# Patient Record
Sex: Male | Born: 1965 | Race: Black or African American | Hispanic: No | Marital: Married | Smoking: Current every day smoker
Health system: Southern US, Community
[De-identification: ages and names within clinical notes are randomized; demographics above are authoritative.]

## PROBLEM LIST (undated history)

## (undated) DIAGNOSIS — J449 Chronic obstructive pulmonary disease, unspecified: Secondary | ICD-10-CM

## (undated) DIAGNOSIS — E119 Type 2 diabetes mellitus without complications: Secondary | ICD-10-CM

---

## 2018-12-07 ENCOUNTER — Encounter: Payer: Self-pay | Admitting: Emergency Medicine

## 2018-12-07 ENCOUNTER — Other Ambulatory Visit: Payer: Self-pay

## 2018-12-07 ENCOUNTER — Emergency Department: Payer: Self-pay

## 2018-12-07 ENCOUNTER — Emergency Department
Admission: EM | Admit: 2018-12-07 | Discharge: 2018-12-07 | Disposition: A | Discharge status: Home / Self Care | Payer: Self-pay | Attending: Emergency Medicine | Admitting: Emergency Medicine

## 2018-12-07 DIAGNOSIS — E119 Type 2 diabetes mellitus without complications: Secondary | ICD-10-CM | POA: Insufficient documentation

## 2018-12-07 DIAGNOSIS — R079 Chest pain, unspecified: Secondary | ICD-10-CM | POA: Insufficient documentation

## 2018-12-07 DIAGNOSIS — F17200 Nicotine dependence, unspecified, uncomplicated: Secondary | ICD-10-CM | POA: Insufficient documentation

## 2018-12-07 HISTORY — DX: Type 2 diabetes mellitus without complications: E11.9

## 2018-12-07 LAB — CBC WITH DIFFERENTIAL/PLATELET
Abs Immature Granulocytes: 0.03 10*3/uL (ref 0.00–0.07)
Basophils Absolute: 0.1 10*3/uL (ref 0.0–0.1)
Basophils Relative: 1 %
EOS ABS: 0.2 10*3/uL (ref 0.0–0.5)
Eosinophils Relative: 2 %
HCT: 51.2 % (ref 39.0–52.0)
Hemoglobin: 16.2 g/dL (ref 13.0–17.0)
Immature Granulocytes: 0 %
Lymphocytes Relative: 35 %
Lymphs Abs: 2.7 10*3/uL (ref 0.7–4.0)
MCH: 27.7 pg (ref 26.0–34.0)
MCHC: 31.6 g/dL (ref 30.0–36.0)
MCV: 87.7 fL (ref 80.0–100.0)
MONO ABS: 0.4 10*3/uL (ref 0.1–1.0)
MONOS PCT: 5 %
Neutro Abs: 4.3 10*3/uL (ref 1.7–7.7)
Neutrophils Relative %: 57 %
Platelets: 193 10*3/uL (ref 150–400)
RBC: 5.84 MIL/uL — ABNORMAL HIGH (ref 4.22–5.81)
RDW: 13.2 % (ref 11.5–15.5)
WBC: 7.6 10*3/uL (ref 4.0–10.5)
nRBC: 0 % (ref 0.0–0.2)

## 2018-12-07 LAB — COMPREHENSIVE METABOLIC PANEL
ALT: 25 U/L (ref 0–44)
AST: 20 U/L (ref 15–41)
Albumin: 4.6 g/dL (ref 3.5–5.0)
Alkaline Phosphatase: 62 U/L (ref 38–126)
Anion gap: 7 (ref 5–15)
BUN: 15 mg/dL (ref 6–20)
CO2: 26 mmol/L (ref 22–32)
Calcium: 9.6 mg/dL (ref 8.9–10.3)
Chloride: 105 mmol/L (ref 98–111)
Creatinine, Ser: 1.03 mg/dL (ref 0.61–1.24)
GFR calc non Af Amer: >60 mL/min (ref >60)
Glucose, Bld: 128 mg/dL — ABNORMAL HIGH (ref 70–99)
Potassium: 4.2 mmol/L (ref 3.5–5.1)
Sodium: 138 mmol/L (ref 135–145)
Total Bilirubin: 0.7 mg/dL (ref 0.3–1.2)
Total Protein: 7.7 g/dL (ref 6.5–8.1)

## 2018-12-07 LAB — TROPONIN I: Troponin I: <0.03 ng/mL (ref <0.03)

## 2018-12-07 LAB — FIBRIN DERIVATIVES D-DIMER (ARMC ONLY): Fibrin derivatives D-dimer (ARMC): 303.53 ng/mL (FEU) (ref 0.00–499.00)

## 2018-12-07 MED ORDER — ASPIRIN 81 MG PO CHEW
324.0000 mg | CHEWABLE_TABLET | Freq: Once | ORAL | Status: AC
  Administered 2018-12-07: 324 mg via ORAL
  Filled 2018-12-07: qty 4

## 2018-12-07 MED ORDER — MORPHINE SULFATE (PF) 2 MG/ML IV SOLN
2.0000 mg | Freq: Once | INTRAVENOUS | Status: AC
  Administered 2018-12-07: 2 mg via INTRAVENOUS
  Filled 2018-12-07: qty 1

## 2018-12-07 NOTE — Discharge Instructions (Signed)

## 2018-12-07 NOTE — ED Provider Notes (Signed)
[name] Emergency Department Provider Note    First MD Initiated Contact with Patient 12/07/18 [phone]     (approximate)  I have reviewed the triage vital signs and the nursing notes.   HISTORY  Chief Complaint Chest Pain    HPI Vincent Morton is a 53 y.o. male with history of diabetes mellitus and 30-year history of crack cocaine use (patient quit 8 years ago) presents with intermittent nonradiating central sharp chest pain times "months".  Patient states that episodes are usually brief and as a result he has not seek medical attention for it.  Patient states however tonight pain was intense with current pain score of 9 out of 10 worse with deep inspiration, movement and palpation.  Patient denies any lower extremity pain or swelling.  Patient denies any nausea or diaphoresis.   Past Medical History:  Diagnosis Date  . Diabetes mellitus without complication (HCC)     There are no active problems to display for this patient.   History reviewed. No pertinent surgical history.  Prior to Admission medications   Not on File    Allergies Patient has no known allergies.  Family history Brother and mother with history of congestive heart failure  Social History Social History   Tobacco Use  . Smoking status: Current Every Day Smoker  . Smokeless tobacco: Never Used  Substance Use Topics  . Alcohol use: Not on file  . Drug use: Not on file    Review of Systems  Constitutional: No fever/chills Eyes: No visual changes. ENT: No sore throat. Cardiovascular: Positive for chest pain. Respiratory: Denies shortness of breath. Gastrointestinal: No abdominal pain.  No nausea, no vomiting.  No diarrhea.  No constipation. Genitourinary: Negative for dysuria. Musculoskeletal: Negative for neck pain.  Negative for back pain. Integumentary: Negative for rash. Neurological: Negative for headaches, focal weakness or  numbness.   ____________________________________________   PHYSICAL EXAM:  VITAL SIGNS: ED Triage Vitals  Enc Vitals Group     BP 12/07/18 0450 130/70     Pulse Rate 12/07/18 0450 70     Resp 12/07/18 0450 18     Temp 12/07/18 0450 98.6 F (37 C)     Temp Source 12/07/18 0450 Oral     SpO2 12/07/18 0450 97 %     Weight 12/07/18 0448 72.6 kg (160 lb)     Height 12/07/18 0448 1.727 m (5\' 8" )     Head Circumference --      Peak Flow --      Pain Score 12/07/18 0448 7     Pain Loc --      Pain Edu? --      Excl. in GC? --     Constitutional: Alert and oriented.  Apparent discomfort  eyes: Conjunctivae are normal.  Mouth/Throat: Mucous membranes are moist.  Oropharynx non-erythematous. Neck: No stridor.   Chest: Pain with parasternal palpation. Cardiovascular: Normal rate, regular rhythm. Good peripheral circulation. Grossly normal heart sounds. Respiratory: Normal respiratory effort.  No retractions. Lungs CTAB. Gastrointestinal: Soft and nontender. No distention.  Musculoskeletal: No lower extremity tenderness nor edema. No gross deformities of extremities. Neurologic:  Normal speech and language. No gross focal neurologic deficits are appreciated.  Skin:  Skin is warm, dry and intact. No rash noted. Psychiatric: Mood and affect are normal. Speech and behavior are normal.  ____________________________________________   LABS (all labs ordered are listed, but only abnormal results are displayed)  Labs Reviewed  CBC WITH DIFFERENTIAL/PLATELET - Abnormal;  Notable for the following components:      Result Value   RBC 5.84 (*)    All other components within normal limits  COMPREHENSIVE METABOLIC PANEL - Abnormal; Notable for the following components:   Glucose, Bld 128 (*)    All other components within normal limits  TROPONIN I  TROPONIN I  FIBRIN DERIVATIVES D-DIMER ([facility] ONLY)   ____________________________________________  EKG  ED ECG REPORT I, [place] N  Miguel Medal, the attending physician, personally viewed and interpreted this ECG.   Date: 12/07/2018  EKG Time: 4:49 AM  Rate: 70  Rhythm: Normal sinus rhythm  Axis: Normal  Intervals: Normal  ST&T Change: None  ____________________________________________  RADIOLOGY I, [place] Jonuel Butterfield, personally viewed and evaluated these images (plain radiographs) as part of my medical decision making, as well as reviewing the written report by the radiologist.  ED MD interpretation:  Negative chest pain  Official radiology report(s): Dg Chest 2 View  Result Date: 12/07/2018 CLINICAL DATA:  Left-sided chest pain for 1 month. EXAM: CHEST - 2 VIEW COMPARISON:  None. FINDINGS: Normal heart size and mediastinal contours. No acute infiltrate or edema. No effusion or pneumothorax. No acute osseous findings. IMPRESSION: Negative chest. Electronically Signed   By: Marnee Spring M.D.   On: 12/07/2018 05:22      Procedures   ____________________________________________   INITIAL IMPRESSION / ASSESSMENT AND PLAN / ED COURSE  As part of my medical decision making, I reviewed the following data within the electronic MEDICAL RECORD NUMBER  53 year old male presenting with above-stated history and physical exam differential diagnosis including costochondritis CAD PE thoracic aortic aneurysm pneumomediastinum etc.  EKG revealed no evidence of ischemia or infarction.  Troponin negative will obtain a second troponin.  Patient low risk possibility for pulmonary emboli and as such d-dimer employed and is pending at this time.  Plan to perform CT angiogram if d-dimer abnormal.  Patient given 2 mg IV morphine and aspirin 3 and 24 mg.  Patient's care transferred to Dr. Fanny Bien     ____________________________________________  FINAL CLINICAL IMPRESSION(S) / ED DIAGNOSES  Final diagnoses:  Chest pain, unspecified type     MEDICATIONS GIVEN DURING THIS VISIT:  Medications  morphine 2 MG/ML injection 2 mg (has no  administration in time range)  aspirin chewable tablet 324 mg (has no administration in time range)     ED Discharge Orders    None       Note:  This document was prepared using Dragon voice recognition software and may include unintentional dictation errors.    Darci Current, MD 12/07/18 [phone]

## 2018-12-07 NOTE — ED Triage Notes (Signed)
Pt to triage via w/c with no distress noted; c/o left sided CP x month, worse with deep breathing; denies hx of same

## 2018-12-07 NOTE — ED Provider Notes (Signed)
Patient resting comfortably.  Repeat troponin and d-dimer are normal.  Patient resting, reports he feels well right now, agreeable with plan to follow-up with cardiology.  Describes that his symptoms have been present for about a year and most notable when he is moving or lifting heavy objects.  Based on the clinical history provides to me and also reviewed the note I suspect is likely musculoskeletal in nature.  He appears well and stable for discharge.  Will follow up with cardiology.  Return precautions and treatment recommendations and follow-up discussed with the patient who is agreeable with the plan.    Sharyn Creamer, MD 12/07/18 [phone]

## 2019-03-04 ENCOUNTER — Emergency Department: Payer: Self-pay

## 2019-03-04 ENCOUNTER — Emergency Department
Admission: EM | Admit: 2019-03-04 | Discharge: 2019-03-04 | Disposition: A | Discharge status: Home / Self Care | Payer: Self-pay | Attending: Emergency Medicine | Admitting: Emergency Medicine

## 2019-03-04 ENCOUNTER — Other Ambulatory Visit: Payer: Self-pay

## 2019-03-04 ENCOUNTER — Encounter: Payer: Self-pay | Admitting: Emergency Medicine

## 2019-03-04 DIAGNOSIS — K529 Noninfective gastroenteritis and colitis, unspecified: Secondary | ICD-10-CM

## 2019-03-04 DIAGNOSIS — R1030 Lower abdominal pain, unspecified: Secondary | ICD-10-CM

## 2019-03-04 DIAGNOSIS — N50819 Testicular pain, unspecified: Secondary | ICD-10-CM

## 2019-03-04 DIAGNOSIS — F17210 Nicotine dependence, cigarettes, uncomplicated: Secondary | ICD-10-CM | POA: Insufficient documentation

## 2019-03-04 DIAGNOSIS — Z7984 Long term (current) use of oral hypoglycemic drugs: Secondary | ICD-10-CM | POA: Insufficient documentation

## 2019-03-04 DIAGNOSIS — E119 Type 2 diabetes mellitus without complications: Secondary | ICD-10-CM | POA: Insufficient documentation

## 2019-03-04 LAB — URINALYSIS, COMPLETE (UACMP) WITH MICROSCOPIC
Bacteria, UA: NONE SEEN
Bilirubin Urine: NEGATIVE
Glucose, UA: 50 mg/dL — AB
Ketones, ur: NEGATIVE mg/dL
Leukocytes,Ua: NEGATIVE
Nitrite: NEGATIVE
Protein, ur: NEGATIVE mg/dL
Specific Gravity, Urine: 1.015 (ref 1.005–1.030)
Squamous Epithelial / LPF: NONE SEEN (ref 0–5)
pH: 5 (ref 5.0–8.0)

## 2019-03-04 LAB — COMPREHENSIVE METABOLIC PANEL
ALT: 25 U/L (ref 0–44)
AST: 19 U/L (ref 15–41)
Albumin: 4.4 g/dL (ref 3.5–5.0)
Alkaline Phosphatase: 62 U/L (ref 38–126)
Anion gap: 7 (ref 5–15)
BUN: 12 mg/dL (ref 6–20)
CO2: 23 mmol/L (ref 22–32)
Calcium: 9.5 mg/dL (ref 8.9–10.3)
Chloride: 107 mmol/L (ref 98–111)
Creatinine, Ser: 0.97 mg/dL (ref 0.61–1.24)
GFR calc Af Amer: >60 mL/min (ref >60)
GFR calc non Af Amer: >60 mL/min (ref >60)
Glucose, Bld: 112 mg/dL — ABNORMAL HIGH (ref 70–99)
Potassium: 4.1 mmol/L (ref 3.5–5.1)
Sodium: 137 mmol/L (ref 135–145)
Total Bilirubin: 0.9 mg/dL (ref 0.3–1.2)
Total Protein: 8.1 g/dL (ref 6.5–8.1)

## 2019-03-04 LAB — CBC WITH DIFFERENTIAL/PLATELET
Abs Immature Granulocytes: 0.01 10*3/uL (ref 0.00–0.07)
Basophils Absolute: 0 10*3/uL (ref 0.0–0.1)
Basophils Relative: 1 %
Eosinophils Absolute: 0.1 10*3/uL (ref 0.0–0.5)
Eosinophils Relative: 2 %
HCT: 46.6 % (ref 39.0–52.0)
Hemoglobin: 15.1 g/dL (ref 13.0–17.0)
Immature Granulocytes: 0 %
Lymphocytes Relative: 39 %
Lymphs Abs: 2.7 10*3/uL (ref 0.7–4.0)
MCH: 27.9 pg (ref 26.0–34.0)
MCHC: 32.4 g/dL (ref 30.0–36.0)
MCV: 86 fL (ref 80.0–100.0)
Monocytes Absolute: 0.5 10*3/uL (ref 0.1–1.0)
Monocytes Relative: 7 %
Neutro Abs: 3.6 10*3/uL (ref 1.7–7.7)
Neutrophils Relative %: 51 %
Platelets: 185 10*3/uL (ref 150–400)
RBC: 5.42 MIL/uL (ref 4.22–5.81)
RDW: 13.3 % (ref 11.5–15.5)
WBC: 7 10*3/uL (ref 4.0–10.5)
nRBC: 0 % (ref 0.0–0.2)

## 2019-03-04 LAB — LIPASE, BLOOD: Lipase: 29 U/L (ref 11–51)

## 2019-03-04 MED ORDER — IOHEXOL 240 MG/ML SOLN
50.0000 mL | Freq: Once | INTRAMUSCULAR | Status: AC | PRN
  Administered 2019-03-04: 50 mL via ORAL

## 2019-03-04 MED ORDER — METRONIDAZOLE 500 MG PO TABS: 500.0000 mg | ORAL_TABLET | Freq: Two times a day (BID) | ORAL | 0 refills | Status: DC

## 2019-03-04 MED ORDER — CIPROFLOXACIN HCL 500 MG PO TABS: 500.0000 mg | ORAL_TABLET | Freq: Two times a day (BID) | ORAL | 0 refills | Status: AC

## 2019-03-04 MED ORDER — IOHEXOL 300 MG/ML  SOLN
100.0000 mL | Freq: Once | INTRAMUSCULAR | Status: AC | PRN
  Administered 2019-03-04: 15:00:00 100 mL via INTRAVENOUS

## 2019-03-04 NOTE — ED Notes (Signed)
Pt verbalized understanding of discharge instructions. NAD at this time. 

## 2019-03-04 NOTE — ED Provider Notes (Signed)
Medical screening examination/treatment/procedure(s) were conducted as a shared visit with non-physician practitioner(s) and myself.  I personally evaluated the patient during the encounter.     I personally saw and evaluated the patient.  He is been having lower abdominal pain somewhat bilateral in the bladder region for about a week.  It started after he and his wife engaged in anal intercourse about a week ago and he is also noticed that the left testicle feels a little sore around the backside of it.  On examination both testicles appear normal in size but does some slight tenderness over the epididymis on the left but no obvious swelling.  No scrotal erythema.  Circumcised penis is normal and non-erect.  There is no groin mass or hernia to inguinal canal inspection.  He has mild tenderness across the suprapubic bladder region but no rebound or guarding in any spot.  In the clinical context, suspicious he may have developed some type of epididymitis or prostatitis, but further evaluation given the patient's age and chronicity of a week of symptoms is warranted I will obtain CT imaging to further evaluate exclude other acute intra-abdominal causes such as diverticulitis, appendicitis, etc.   Ongoing care of this patient signed over to Dr. Derrill Kay at 3:01 PM.  In conjunction with nurse practitioner, please follow-up on CT and ultrasound imaging.  Some suspicion that this could represent prostatitis given the low component of abdominal pain, but does have tenderness in the region and CT is presently pending.  Anticipate likely discharge to home, but results pending and will need follow-up on CT   Sharyn Creamer, MD 03/04/19 1516

## 2019-03-04 NOTE — ED Triage Notes (Signed)
Pt presents to ED via POV with c/o abdominal pain x 1 week. Pt states had scheduled CT of his abdomen however came in today due to pain. Pt c/o nausea this morning, also c/o R flank pain. Pt states hx of enlarged prostate at this time, takes Flomax with relief.

## 2019-03-04 NOTE — ED Notes (Addendum)
FNP Clearnce Sorrel at bedside

## 2019-03-04 NOTE — ED Provider Notes (Signed)
[facility] Emergency Department Provider Note ____________________________________________   None    (approximate)  I have reviewed the triage vital signs and the nursing notes.   HISTORY  Chief Complaint Abdominal Pain  HPI Vincent Morton is a 53 y.o. male who presents to the emergency department for treatment and evaluation of abdominal pain. Pain has been intermittently present for the past 2 weeks. He states that he has a history of "bowel infection" and this feels similar.  Pain is in the left upper quadrant as well as transverse lower abdomen.  Symptoms started after sexual intercourse 2 weeks ago. He and his wife "tried something different" and after ejaculation he "peed blood." Blood stopped the next morning and has not noticed any blood since.      Past Medical History:  Diagnosis Date  . Diabetes mellitus without complication (HCC)     There are no active problems to display for this patient.   History reviewed. No pertinent surgical history.  Prior to Admission medications   Medication Sig Start Date End Date Taking? Authorizing Provider  acetaminophen (TYLENOL) 500 MG tablet Take 500 mg by mouth as needed.    [provider]  ciprofloxacin (CIPRO) 500 MG tablet Take 1 tablet (500 mg total) by mouth 2 (two) times daily for 10 days. 03/04/19 03/14/19  Dorean Daniello, Rulon Eisenmenger B, FNP  metFORMIN (GLUCOPHAGE-XR) 500 MG 24 hr tablet Take 1,000 mg by mouth daily with breakfast. 11/22/17   [provider]  metroNIDAZOLE (FLAGYL) 500 MG tablet Take 1 tablet (500 mg total) by mouth 2 (two) times daily. 03/04/19   Chinita Pester, FNP    Allergies Patient has no known allergies.  History reviewed. No pertinent family history.  Social History Social History   Tobacco Use  . Smoking status: Current Every Day Smoker    Types: Cigarettes  . Smokeless tobacco: Never Used  Substance Use Topics  . Alcohol use: Not Currently    Comment: Last  drink 102yrs  . Drug use: Yes    Types: Marijuana    Review of Systems  Constitutional: No fever/chills Eyes: No visual changes. ENT: No sore throat. Cardiovascular: Denies chest pain. Respiratory: Denies shortness of breath. Gastrointestinal: Positive for abdominal pain.  No nausea, no vomiting.  No diarrhea.  Positive for constipation. Genitourinary: Negative for dysuria. Musculoskeletal: Negative for back pain. Skin: Negative for rash. Neurological: Negative for headaches, focal weakness or numbness. ____________________________________________   PHYSICAL EXAM:  VITAL SIGNS: ED Triage Vitals [03/04/19 1203]  Enc Vitals Group     BP (!) 165/110     Pulse Rate 67     Resp 18     Temp 98 F (36.7 C)     Temp Source Oral     SpO2 99 %     Weight 178 lb (80.7 kg)     Height  (1.727 m)     Head Circumference      Peak Flow      Pain Score 7     Pain Loc      Pain Edu?      Excl. in GC?     Constitutional: Alert and oriented. Well appearing and in no acute distress. Eyes: Conjunctivae are normal. PERRL. EOMI. Head: Atraumatic. Nose: No congestion/rhinnorhea. Mouth/Throat: Mucous membranes are moist.  Oropharynx non-erythematous. Neck: No stridor.   Cardiovascular: Normal rate, regular rhythm. Grossly normal heart sounds.  Good peripheral circulation. Respiratory: Normal respiratory effort.  No retractions. Lungs CTAB. Gastrointestinal:  Soft and diffusely tender. No distention. No abdominal bruits. No CVA tenderness. Musculoskeletal: No lower extremity tenderness nor edema.  No joint effusions. Neurologic:  Normal speech and language. No gross focal neurologic deficits are appreciated. No gait instability. Skin:  Skin is warm, dry and intact. No rash noted. Psychiatric: Mood and affect are normal. Speech and behavior are normal.  ____________________________________________   LABS (all labs ordered are listed, but only abnormal results are displayed)  Labs  Reviewed  COMPREHENSIVE METABOLIC PANEL - Abnormal; Notable for the following components:      Result Value   Glucose, Bld 112 (*)    All other components within normal limits  URINALYSIS, COMPLETE (UACMP) WITH MICROSCOPIC - Abnormal; Notable for the following components:   Color, Urine YELLOW (*)    APPearance CLEAR (*)    Glucose, UA 50 (*)    Hgb urine dipstick SMALL (*)    All other components within normal limits  CBC WITH DIFFERENTIAL/PLATELET  LIPASE, BLOOD   ____________________________________________  EKG  Not indicated ____________________________________________  RADIOLOGY  ED MD interpretation: Ultrasound of the scrotum shows a negative scrotal ultrasound and no evidence of torsion or epididymitis.  CT of the abdomen and pelvis shows inflammation and mild wall thickening involving the base of the cecum, the ileocecal region, and transverse colon including the descending and rectosigmoid colon that is consistent with enterocolitis or infectious or inflammatory etiology.  Official radiology report(s): US Scrotum  Result Date: 03/04/2019 CLINICAL DATA:  Left testicle pain after intercourse EXAM: SCROTAL ULTRASOUND DOPPLER ULTRASOUND OF THE TESTICLES TECHNIQUE: Complete ultrasound examination of the testicles, epididymis, and other scrotal structures was performed. Color and spectral Doppler ultrasound were also utilized to evaluate blood flow to the testicles. COMPARISON:  None. FINDINGS: Right testicle Measurements: 4.6 x 2.4 x 2.9 cm. No mass or microlithiasis visualized. Left testicle Measurements: 5.2 x 2.2 x 2.7 cm. No mass or microlithiasis visualized. Right epididymis:  Normal in size and appearance. Left epididymis:  Normal in size and appearance. Hydrocele:  None visualized. Varicocele:  None visualized. Pulsed Doppler interrogation of both testes demonstrates normal low resistance arterial and venous waveforms bilaterally. IMPRESSION: Negative scrotal ultrasound.  No  evidence for torsion. Electronically Signed   By: Jasmine Pang M.D.   On: 03/04/2019 15:01   Ct Abdomen Pelvis W Contrast  Result Date: 03/04/2019 CLINICAL DATA:  Abdominal pain for 1 week nausea and right flank pain EXAM: CT ABDOMEN AND PELVIS WITH CONTRAST TECHNIQUE: Multidetector CT imaging of the abdomen and pelvis was performed using the standard protocol following bolus administration of intravenous contrast. CONTRAST:  OMNIPAQUE IOHEXOL 300 MG/ML  SOLN COMPARISON:  None. FINDINGS: Lower chest: Lung bases demonstrate no acute consolidation or effusion. 4 mm right middle lobe pulmonary nodule, series 4, image number 5. Heart size upper normal. Small hiatal hernia Hepatobiliary: Subcentimeter hypodensity within the inferior right hepatic lobe too small to further characterize. No calcified gallstone or biliary dilatation Pancreas: Unremarkable. No pancreatic ductal dilatation or surrounding inflammatory changes. Spleen: Normal in size without focal abnormality. Adrenals/Urinary Tract: Adrenal glands are normal. No hydronephrosis. Small cysts in the right kidney. Additional subcentimeter hypodensities too small to further characterize. Bladder normal Stomach/Bowel: Stomach is nonenlarged. No dilated small bowel. Negative appendix. Mild wall thickening at the base of the cecum and at the ileocecal region. Patchy areas of mild wall thickening involving the transverse colon, descending colon, and rectosigmoid colon. Mild edema in the perirectal fat. Vascular/Lymphatic: Nonaneurysmal aorta. No significantly enlarged lymph nodes  Reproductive: Slightly enlarged prostate gland with heterogeneous central enhancement. Other: Negative for free air or free fluid Musculoskeletal: No acute or significant osseous findings. IMPRESSION: 1. Patchy areas of mild wall thickening involving the base of cecum, ileocecal region, transverse colon, descending and rectosigmoid colon, consistent enterocolitis of infectious or  inflammatory etiology. 2. 4 mm right middle lobe pulmonary nodule. No follow-up needed if patient is low-risk. Non-contrast chest CT can be considered in 12 months if patient is high-risk. This recommendation follows the consensus statement: Guidelines for Management of Incidental Pulmonary Nodules Detected on CT Images: From the Fleischner Society 2017; Radiology 2017; 284:228-243. 3. Slightly enlarged heterogeneous prostate gland. Electronically Signed   By: Jasmine PangKim  Fujinaga M.D.   On: 03/04/2019 15:09   [place] Scrotum Doppler  Result Date: 03/04/2019 CLINICAL DATA:  Left testicle pain after intercourse EXAM: SCROTAL ULTRASOUND DOPPLER ULTRASOUND OF THE TESTICLES TECHNIQUE: Complete ultrasound examination of the testicles, epididymis, and other scrotal structures was performed. Color and spectral Doppler ultrasound were also utilized to evaluate blood flow to the testicles. COMPARISON:  None. FINDINGS: Right testicle Measurements: 4.6 x 2.4 x 2.9 cm. No mass or microlithiasis visualized. Left testicle Measurements: 5.2 x 2.2 x 2.7 cm. No mass or microlithiasis visualized. Right epididymis:  Normal in size and appearance. Left epididymis:  Normal in size and appearance. Hydrocele:  None visualized. Varicocele:  None visualized. Pulsed Doppler interrogation of both testes demonstrates normal low resistance arterial and venous waveforms bilaterally. IMPRESSION: Negative scrotal ultrasound.  No evidence for torsion. Electronically Signed   By: Jasmine PangKim  Fujinaga M.D.   On: 03/04/2019 15:01    ____________________________________________   PROCEDURES  Procedure(s) performed: None  Procedures  Critical Care performed: No  ____________________________________________   INITIAL IMPRESSION / ASSESSMENT AND PLAN / ED COURSE     53 year old male presenting to the emergency department for treatment and evaluation of abdominal pain.  Approximately 2 weeks prior to onset he and his wife experimented with anal sex  and since then he has had some abdominal pain.  He denies any penile discharge or scrotal discomfort.  He does have a history of colitis and states that the pain is similar therefore a CT of the abdomen and pelvis is to be completed as well as an ultrasound of the scrotum.   Scrotal ultrasound is reassuring and negative for epididymitis or other concerning findings.  Urine does not indicate infection.  With both tests being reassuring, it is unlikely that he has prostatitis.  CT of the abdomen and pelvis does show colitis for which he will be treated with Flagyl and Cipro.    ___________________________________________   FINAL CLINICAL IMPRESSION(S) / ED DIAGNOSES  Final diagnoses:  Colitis  Lower abdominal pain     ED Discharge Orders         Ordered    ciprofloxacin (CIPRO) 500 MG tablet  2 times daily     03/04/19 1549    metroNIDAZOLE (FLAGYL) 500 MG tablet  2 times daily     03/04/19 1549           Note:  This document was prepared using Dragon voice recognition software and may include unintentional dictation errors.    Chinita Pesterriplett, Chrysta Fulcher B, FNP 03/04/19 2028    Sharyn CreamerQuale, Mark, MD 03/10/19 0003

## 2019-03-04 NOTE — ED Notes (Signed)
Patient transported to Ultrasound 

## 2019-06-18 ENCOUNTER — Encounter: Payer: Self-pay | Admitting: Emergency Medicine

## 2019-06-18 ENCOUNTER — Emergency Department
Admission: EM | Admit: 2019-06-18 | Discharge: 2019-06-18 | Disposition: A | Discharge status: Home / Self Care | Payer: Self-pay | Attending: Emergency Medicine | Admitting: Emergency Medicine

## 2019-06-18 ENCOUNTER — Other Ambulatory Visit: Payer: Self-pay

## 2019-06-18 DIAGNOSIS — A6000 Herpesviral infection of urogenital system, unspecified: Secondary | ICD-10-CM | POA: Insufficient documentation

## 2019-06-18 DIAGNOSIS — F17210 Nicotine dependence, cigarettes, uncomplicated: Secondary | ICD-10-CM | POA: Insufficient documentation

## 2019-06-18 DIAGNOSIS — E119 Type 2 diabetes mellitus without complications: Secondary | ICD-10-CM | POA: Insufficient documentation

## 2019-06-18 LAB — CBC
HCT: 49.5 % (ref 39.0–52.0)
Hemoglobin: 15.8 g/dL (ref 13.0–17.0)
MCH: 27.8 pg (ref 26.0–34.0)
MCHC: 31.9 g/dL (ref 30.0–36.0)
MCV: 87.1 fL (ref 80.0–100.0)
Platelets: 174 10*3/uL (ref 150–400)
RBC: 5.68 MIL/uL (ref 4.22–5.81)
RDW: 13.2 % (ref 11.5–15.5)
WBC: 6.9 10*3/uL (ref 4.0–10.5)
nRBC: 0 % (ref 0.0–0.2)

## 2019-06-18 LAB — COMPREHENSIVE METABOLIC PANEL
ALT: 23 U/L (ref 0–44)
AST: 20 U/L (ref 15–41)
Albumin: 4.5 g/dL (ref 3.5–5.0)
Alkaline Phosphatase: 81 U/L (ref 38–126)
Anion gap: 11 (ref 5–15)
BUN: 14 mg/dL (ref 6–20)
CO2: 20 mmol/L — ABNORMAL LOW (ref 22–32)
Calcium: 8.6 mg/dL — ABNORMAL LOW (ref 8.9–10.3)
Chloride: 99 mmol/L (ref 98–111)
Creatinine, Ser: 1.26 mg/dL — ABNORMAL HIGH (ref 0.61–1.24)
GFR calc Af Amer: >60 mL/min (ref >60)
GFR calc non Af Amer: >60 mL/min (ref >60)
Glucose, Bld: 134 mg/dL — ABNORMAL HIGH (ref 70–99)
Potassium: 4.5 mmol/L (ref 3.5–5.1)
Sodium: 130 mmol/L — ABNORMAL LOW (ref 135–145)
Total Bilirubin: 1 mg/dL (ref 0.3–1.2)
Total Protein: 7.7 g/dL (ref 6.5–8.1)

## 2019-06-18 LAB — URINALYSIS, COMPLETE (UACMP) WITH MICROSCOPIC
Bilirubin Urine: NEGATIVE
Glucose, UA: NEGATIVE mg/dL
Hgb urine dipstick: NEGATIVE
Ketones, ur: NEGATIVE mg/dL
Leukocytes,Ua: NEGATIVE
Nitrite: NEGATIVE
Protein, ur: NEGATIVE mg/dL
Specific Gravity, Urine: 1.019 (ref 1.005–1.030)
pH: 5 (ref 5.0–8.0)

## 2019-06-18 LAB — CHLAMYDIA/NGC RT PCR (ARMC ONLY)??????????: Chlamydia Tr: NOT DETECTED

## 2019-06-18 LAB — CHLAMYDIA/NGC RT PCR (ARMC ONLY): N gonorrhoeae: NOT DETECTED

## 2019-06-18 LAB — LIPASE, BLOOD: Lipase: 42 U/L (ref 11–51)

## 2019-06-18 MED ORDER — IBUPROFEN 800 MG PO TABS: 800.0000 mg | ORAL_TABLET | Freq: Three times a day (TID) | ORAL | 0 refills | Status: DC | PRN

## 2019-06-18 MED ORDER — SODIUM CHLORIDE 0.9% FLUSH: 3.0000 mL | Freq: Once | INTRAVENOUS | Status: DC

## 2019-06-18 MED ORDER — VALACYCLOVIR HCL 1 G PO TABS: 1000.0000 mg | ORAL_TABLET | Freq: Two times a day (BID) | ORAL | 0 refills | Status: DC

## 2019-06-18 MED ORDER — VALACYCLOVIR HCL 1 G PO TABS: 1000.0000 mg | ORAL_TABLET | Freq: Two times a day (BID) | ORAL | 0 refills | Status: AC

## 2019-06-18 NOTE — ED Triage Notes (Signed)
Says he has pain bilat groin arount to back bilaterally for 2 days.  Also foreskin is swelling.

## 2019-06-18 NOTE — ED Provider Notes (Signed)
[facility] Emergency Department Provider Note       Time seen: ----------------------------------------- 7:58 AM on 06/18/2019 -----------------------------------------   I have reviewed the triage vital signs and the nursing notes.  HISTORY   Chief Complaint Groin Pain    HPI Vincent Morton is a 53 y.o. male with a history of diabetes who presents to the ED for pain from bilateral groin and around to his back for several days.  He also complains of pain and swelling to his foreskin.  Patient states he cannot sleep on his stomach because of pain at his foreskin.  He denies any other complaints at this time.  Past Medical History:  Diagnosis Date  . Diabetes mellitus without complication (HCC)     There are no active problems to display for this patient.   History reviewed. No pertinent surgical history.  Allergies Patient has no known allergies.  Social History Social History   Tobacco Use  . Smoking status: Current Every Day Smoker    Types: Cigarettes  . Smokeless tobacco: Never Used  Substance Use Topics  . Alcohol use: Not Currently    Comment: Last drink 2823yrs  . Drug use: Yes    Types: Marijuana   Review of Systems Constitutional: Negative for fever. Cardiovascular: Negative for chest pain. Respiratory: Negative for shortness of breath. Gastrointestinal: Negative for abdominal pain, vomiting and diarrhea. Genitourinary: Positive for inguinal pain, pain to his foreskin Musculoskeletal: Positive for low back pain Skin: Negative for rash. Neurological: Negative for headaches, focal weakness or numbness.  All systems negative/normal/unremarkable except as stated in the HPI  ____________________________________________   PHYSICAL EXAM:  VITAL SIGNS: ED Triage Vitals [06/18/19 0743]  Enc Vitals Group     BP (!) 164/86     Pulse Rate 63     Resp 18     Temp 98.2 F (36.8 C)     Temp Source Oral     SpO2 100 %     Weight  173 lb (78.5 kg)     Height 5\' 8"  (1.727 m)     Head Circumference      Peak Flow      Pain Score      Pain Loc      Pain Edu?      Excl. in GC?    Constitutional: Alert and oriented. Well appearing and in no distress. Cardiovascular: Normal rate, regular rhythm. No murmurs, rubs, or gallops. Respiratory: Normal respiratory effort without tachypnea nor retractions. Breath sounds are clear and equal bilaterally. No wheezes/rales/rhonchi. Gastrointestinal: Soft and nontender. Normal bowel sounds Genitourinary: Bilateral inguinal adenopathy is noted that is mildly tender to touch.  There is swelling to his foreskin with blister formation distally. Musculoskeletal: Nontender with normal range of motion in extremities. No lower extremity tenderness nor edema. Neurologic:  Normal speech and language. No gross focal neurologic deficits are appreciated.  Skin:  Skin is warm, dry and intact. No rash noted. Psychiatric: Mood and affect are normal. Speech and behavior are normal.  ____________________________________________  ED COURSE:  As part of my medical decision making, I reviewed the following data within the electronic MEDICAL RECORD NUMBER History obtained from family if available, nursing notes, old chart and ekg, as well as notes from prior ED visits. Patient presented for groin pain, we will assess with labs as indicated at this time.   Procedures  Vincent Morton was evaluated in Emergency Department on 06/18/2019 for the symptoms described in the history of present illness.  He was evaluated in the context of the global COVID-19 pandemic, which necessitated consideration that the patient might be at risk for infection with the SARS-CoV-2 virus that causes COVID-19. Institutional protocols and algorithms that pertain to the evaluation of patients at risk for COVID-19 are in a state of rapid change based on information released by regulatory bodies including the CDC and federal and state  organizations. These policies and algorithms were followed during the patient's care in the ED.  ____________________________________________   LABS (pertinent positives/negatives)  Labs Reviewed  URINALYSIS, COMPLETE (UACMP) WITH MICROSCOPIC - Abnormal; Notable for the following components:      Result Value   Color, Urine YELLOW (*)    APPearance CLEAR (*)    Bacteria, UA RARE (*)    All other components within normal limits  CBC  LIPASE, BLOOD  COMPREHENSIVE METABOLIC PANEL  ____________________________________________   DIFFERENTIAL DIAGNOSIS   Herpes genitalis, cellulitis, inguinal adenopathy  FINAL ASSESSMENT AND PLAN  Herpes genitalis   Plan: The patient had presented for inguinal pain. Patient's labs were unremarkable.  Patient be treated with valacyclovir twice a day for 10 days.  Have discussed the need for his wife to be treated as well.  He is cleared for outpatient follow-up.   Laurence Aly, MD    Note: This note was generated in part or whole with voice recognition software. Voice recognition is usually quite accurate but there are transcription errors that can and very often do occur. I apologize for any typographical errors that were not detected and corrected.     Earleen Newport, MD 06/18/19 0830

## 2019-12-10 ENCOUNTER — Emergency Department: Payer: Self-pay

## 2019-12-10 ENCOUNTER — Other Ambulatory Visit: Payer: Self-pay

## 2019-12-10 ENCOUNTER — Emergency Department
Admission: EM | Admit: 2019-12-10 | Discharge: 2019-12-10 | Disposition: A | Discharge status: Home / Self Care | Payer: Self-pay | Attending: Emergency Medicine | Admitting: Emergency Medicine

## 2019-12-10 ENCOUNTER — Encounter: Payer: Self-pay | Admitting: Emergency Medicine

## 2019-12-10 DIAGNOSIS — E119 Type 2 diabetes mellitus without complications: Secondary | ICD-10-CM | POA: Insufficient documentation

## 2019-12-10 DIAGNOSIS — F17210 Nicotine dependence, cigarettes, uncomplicated: Secondary | ICD-10-CM | POA: Insufficient documentation

## 2019-12-10 DIAGNOSIS — Z7984 Long term (current) use of oral hypoglycemic drugs: Secondary | ICD-10-CM | POA: Insufficient documentation

## 2019-12-10 DIAGNOSIS — K5792 Diverticulitis of intestine, part unspecified, without perforation or abscess without bleeding: Secondary | ICD-10-CM | POA: Insufficient documentation

## 2019-12-10 DIAGNOSIS — Z79899 Other long term (current) drug therapy: Secondary | ICD-10-CM | POA: Insufficient documentation

## 2019-12-10 LAB — COMPREHENSIVE METABOLIC PANEL
ALT: 19 U/L (ref 0–44)
AST: 17 U/L (ref 15–41)
Albumin: 4.3 g/dL (ref 3.5–5.0)
Alkaline Phosphatase: 66 U/L (ref 38–126)
Anion gap: 9 (ref 5–15)
BUN: 13 mg/dL (ref 6–20)
CO2: 23 mmol/L (ref 22–32)
Calcium: 9.1 mg/dL (ref 8.9–10.3)
Chloride: 106 mmol/L (ref 98–111)
Creatinine, Ser: 1.13 mg/dL (ref 0.61–1.24)
GFR calc Af Amer: >60 mL/min (ref >60)
GFR calc non Af Amer: >60 mL/min (ref >60)
Glucose, Bld: 181 mg/dL — ABNORMAL HIGH (ref 70–99)
Potassium: 4.2 mmol/L (ref 3.5–5.1)
Sodium: 138 mmol/L (ref 135–145)
Total Bilirubin: 0.9 mg/dL (ref 0.3–1.2)
Total Protein: 7.9 g/dL (ref 6.5–8.1)

## 2019-12-10 LAB — CBC WITH DIFFERENTIAL/PLATELET
Abs Immature Granulocytes: 0.05 10*3/uL (ref 0.00–0.07)
Basophils Absolute: 0.1 10*3/uL (ref 0.0–0.1)
Basophils Relative: 1 %
Eosinophils Absolute: 0.1 10*3/uL (ref 0.0–0.5)
Eosinophils Relative: 1 %
HCT: 47.4 % (ref 39.0–52.0)
Hemoglobin: 15.3 g/dL (ref 13.0–17.0)
Immature Granulocytes: 1 %
Lymphocytes Relative: 26 %
Lymphs Abs: 2.6 10*3/uL (ref 0.7–4.0)
MCH: 27.8 pg (ref 26.0–34.0)
MCHC: 32.3 g/dL (ref 30.0–36.0)
MCV: 86 fL (ref 80.0–100.0)
Monocytes Absolute: 0.6 10*3/uL (ref 0.1–1.0)
Monocytes Relative: 6 %
Neutro Abs: 6.6 10*3/uL (ref 1.7–7.7)
Neutrophils Relative %: 65 %
Platelets: 167 10*3/uL (ref 150–400)
RBC: 5.51 MIL/uL (ref 4.22–5.81)
RDW: 13.3 % (ref 11.5–15.5)
WBC: 10.1 10*3/uL (ref 4.0–10.5)
nRBC: 0 % (ref 0.0–0.2)

## 2019-12-10 LAB — URINALYSIS, ROUTINE W REFLEX MICROSCOPIC
Bilirubin Urine: NEGATIVE
Glucose, UA: 50 mg/dL — AB
Hgb urine dipstick: NEGATIVE
Ketones, ur: NEGATIVE mg/dL
Leukocytes,Ua: NEGATIVE
Nitrite: NEGATIVE
Protein, ur: NEGATIVE mg/dL
Specific Gravity, Urine: 1.018 (ref 1.005–1.030)
pH: 5 (ref 5.0–8.0)

## 2019-12-10 LAB — LIPASE, BLOOD: Lipase: 33 U/L (ref 11–51)

## 2019-12-10 MED ORDER — ONDANSETRON HCL 4 MG/2ML IJ SOLN
4.0000 mg | Freq: Once | INTRAMUSCULAR | Status: AC
  Administered 2019-12-10: 08:00:00 4 mg via INTRAVENOUS
  Filled 2019-12-10: qty 2

## 2019-12-10 MED ORDER — ACETAMINOPHEN 500 MG PO TABS
1000.0000 mg | ORAL_TABLET | Freq: Once | ORAL | Status: AC
  Administered 2019-12-10: 1000 mg via ORAL
  Filled 2019-12-10: qty 2

## 2019-12-10 MED ORDER — ONDANSETRON 4 MG PO TBDP: 4.0000 mg | ORAL_TABLET | Freq: Three times a day (TID) | ORAL | 0 refills | Status: DC | PRN

## 2019-12-10 MED ORDER — CIPROFLOXACIN HCL 500 MG PO TABS: 500.0000 mg | ORAL_TABLET | Freq: Two times a day (BID) | ORAL | 0 refills | Status: AC

## 2019-12-10 MED ORDER — IOHEXOL 300 MG/ML  SOLN
100.0000 mL | Freq: Once | INTRAMUSCULAR | Status: AC | PRN
  Administered 2019-12-10: 100 mL via INTRAVENOUS

## 2019-12-10 MED ORDER — KETOROLAC TROMETHAMINE 30 MG/ML IJ SOLN
15.0000 mg | Freq: Once | INTRAMUSCULAR | Status: AC
  Administered 2019-12-10: 15 mg via INTRAVENOUS
  Filled 2019-12-10: qty 1

## 2019-12-10 MED ORDER — METRONIDAZOLE 500 MG PO TABS: 500.0000 mg | ORAL_TABLET | Freq: Three times a day (TID) | ORAL | 0 refills | Status: AC

## 2019-12-10 MED ORDER — OXYCODONE HCL 5 MG PO TABS: 5.0000 mg | ORAL_TABLET | Freq: Four times a day (QID) | ORAL | 0 refills | Status: AC | PRN

## 2019-12-10 NOTE — ED Triage Notes (Signed)
C/O burning with urination 2-3 days and 1 day of lower abdominal pain. Also c/o nausea

## 2019-12-10 NOTE — Discharge Instructions (Addendum)
We are starting you on some antibiotics to help with the diverticulitis.  You can also take Zofran to help with nausea.  Take Tylenol 1 g every 8 hours and ibuprofen 400 every 6 hours to help with pain.  Take the oxycodone for breakthrough pain.  Do not drive or work while on this.  You will need a colonoscopy in 6 weeks.  CT  Distal descending colon diverticulitis.  No evidence of abscess.

## 2019-12-10 NOTE — ED Provider Notes (Signed)
[facility] Emergency Department Provider Note  ____________________________________________   First MD Initiated Contact with Patient 12/10/19 [phone]     (approximate)  I have reviewed the triage vital signs and the nursing notes.   HISTORY  Chief Complaint Abdominal Pain and Dysuria    HPI Vincent Morton is a 54 y.o. male with diabetes who comes in with abdominal pain.  Patient states that he has had 2 or 3 days of left lower quadrant abdominal pain that is severe, constant, nothing makes it better, nothing makes it worse.  Patient also states that he has had some burning when he pees for the past few days.  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  No prior abdominal surgeries.  Maybe some mild nausea as well but no vomiting.          Past Medical History:  Diagnosis Date  . Diabetes mellitus without complication (HCC)     There are no problems to display for this patient.   History reviewed. No pertinent surgical history.  Prior to Admission medications   Medication Sig Start Date End Date Taking? Authorizing Provider  acetaminophen (TYLENOL) 500 MG tablet Take 500 mg by mouth as needed.    [provider]  ibuprofen (ADVIL) 800 MG tablet Take 1 tablet (800 mg total) by mouth every 8 (eight) hours as needed. 06/18/19   Emily Filbert, MD  ibuprofen (ADVIL) 800 MG tablet Take 1 tablet (800 mg total) by mouth every 8 (eight) hours as needed. 06/18/19   Emily Filbert, MD  metFORMIN (GLUCOPHAGE-XR) 500 MG 24 hr tablet Take 1,000 mg by mouth daily with breakfast. 11/22/17   [provider]  metroNIDAZOLE (FLAGYL) 500 MG tablet Take 1 tablet (500 mg total) by mouth 2 (two) times daily. 03/04/19   Chinita Pester, FNP    Allergies Patient has no known allergies.  No family history on file.  Social History Social History   Tobacco Use  . Smoking status: Current Every Day Smoker    Types: Cigarettes  .  Smokeless tobacco: Never Used  Substance Use Topics  . Alcohol use: Not Currently    Comment: Last drink 41yrs  . Drug use: Yes    Types: Marijuana      Review of Systems Constitutional: No fever/chills Eyes: No visual changes. ENT: No sore throat. Cardiovascular: Denies chest pain. Respiratory: Denies shortness of breath. Gastrointestinal: Positive abdominal pain and nausea no diarrhea.  No constipation. Genitourinary: + dysuria. Musculoskeletal: Negative for back pain. Skin: Negative for rash. Neurological: Negative for headaches, focal weakness or numbness. All other ROS negative ____________________________________________   PHYSICAL EXAM:  VITAL SIGNS: Blood pressure 140/90, pulse 80, temperature (!) 97.5 F (36.4 C), temperature source Oral, weight 78.5 kg, SpO2 100 %.   Constitutional: Alert and oriented. Well appearing and in no acute distress. Eyes: Conjunctivae are normal. EOMI. Head: Atraumatic. Nose: No congestion/rhinnorhea. Mouth/Throat: Mucous membranes are moist.   Neck: No stridor. Trachea Midline. FROM Cardiovascular: Normal rate, regular rhythm. Grossly normal heart sounds.  Good peripheral circulation. Respiratory: Normal respiratory effort.  No retractions. Lungs CTAB. Gastrointestinal: Soft but with left lower quadrant tenderness no distention. No abdominal bruits.  Musculoskeletal: No lower extremity tenderness nor edema.  No joint effusions. Neurologic:  Normal speech and language. No gross focal neurologic deficits are appreciated.  Skin:  Skin is warm, dry and intact. No rash noted. Psychiatric: Mood and affect are normal. Speech and behavior are normal. GU:  Deferred   ____________________________________________   LABS (all labs ordered are listed, but only abnormal results are displayed)  Labs Reviewed  COMPREHENSIVE METABOLIC PANEL - Abnormal; Notable for the following components:      Result Value   Glucose, Bld 181 (*)    All other  components within normal limits  URINALYSIS, ROUTINE W REFLEX MICROSCOPIC - Abnormal; Notable for the following components:   Color, Urine YELLOW (*)    APPearance CLEAR (*)    Glucose, UA 50 (*)    All other components within normal limits  URINE CULTURE  CBC WITH DIFFERENTIAL/PLATELET  LIPASE, BLOOD   ____________________________________________  RADIOLOGY   Official radiology report(s): CT ABDOMEN PELVIS W CONTRAST  Result Date: 12/10/2019 CLINICAL DATA:  Left lower quadrant pain EXAM: CT ABDOMEN AND PELVIS WITH CONTRAST TECHNIQUE: Multidetector CT imaging of the abdomen and pelvis was performed using the standard protocol following bolus administration of intravenous contrast. CONTRAST:  OMNIPAQUE IOHEXOL 300 MG/ML  SOLN COMPARISON:  None. FINDINGS: Lower chest: No acute abnormality. Hepatobiliary: No focal liver lesion.  Gallbladder is contracted. Pancreas: Unremarkable. Spleen: Unremarkable. Adrenals/Urinary Tract: Bilateral renal cysts and additional too small to characterize hypoattenuating lesions also probably reflecting cysts. Bladder is unremarkable. Adrenals are normal in appearance. Stomach/Bowel: Stomach is within normal limits. Bowel is normal in caliber. There is distal colonic diverticulosis. Pericolonic infiltration is present along the distal descending colon. There is no evidence of abscess. Normal appendix. Vascular/Lymphatic: No significant vascular findings are present. No enlarged abdominal or pelvic lymph nodes. Reproductive: Prostate is unremarkable. Other: Minimal left lower quadrant free fluid.  No free air. Musculoskeletal: No acute or significant osseous findings. IMPRESSION: Distal descending colon diverticulitis.  No evidence of abscess. Electronically Signed   By: Guadlupe Spanish M.D.   On: 12/10/2019 09:09    ____________________________________________   PROCEDURES  Procedure(s) performed (including Critical  Care):  Procedures   ____________________________________________   INITIAL IMPRESSION / ASSESSMENT AND PLAN / ED COURSE  Vincent Morton was evaluated in Emergency Department on 12/10/2019 for the symptoms described in the history of present illness. He was evaluated in the context of the global COVID-19 pandemic, which necessitated consideration that the patient might be at risk for infection with the SARS-CoV-2 virus that causes COVID-19. Institutional protocols and algorithms that pertain to the evaluation of patients at risk for COVID-19 are in a state of rapid change based on information released by regulatory bodies including the CDC and federal and state organizations. These policies and algorithms were followed during the patient's care in the ED.    Patient is a well-appearing 54 year old with normal vital signs he comes in with left lower quadrant abdominal pain.  Will get CT scan to evaluate for drip diverticulitis, colitis, perforation, abscess.  Will get UA to evaluate for UTI.  Will get labs to evaluate for electrolyte abnormalities, AKI.  Labs are re-assuring.  UA negative for UTI.  Discussed with patient and denies concerns for STDs and declines testing.  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  Declines examination.  CT scan is consistent with diverticulitis.  Patient is well-appearing.  Pain is well controlled.  No nausea or vomiting.  No white count elevation and no signs of complication.  Patient is amenable to outpatient treatment.  Will send with antibiotics, Zofran, symptomatic treatment with Tylenol ibuprofen and a few oxycodone for breakthrough pain.  Understands he cannot drive or  work while on the oxycodone.  Patient denies prior colonoscopy.  Informed patient that he should have 1 done in 6 weeks  I discussed the provisional nature of ED diagnosis, the treatment so far, the ongoing plan of care,  follow up appointments and return precautions with the patient and any family or support people present. They expressed understanding and agreed with the plan, discharged home.  ____________________________________________   FINAL CLINICAL IMPRESSION(S) / ED DIAGNOSES   Final diagnoses:  Diverticulitis      MEDICATIONS GIVEN DURING THIS VISIT:  Medications  acetaminophen (TYLENOL) tablet 1,000 mg (1,000 mg Oral Given 12/10/19 0820)  ondansetron (ZOFRAN) injection 4 mg (4 mg Intravenous Given 12/10/19 0823)  iohexol (OMNIPAQUE) 300 MG/ML solution 100 mL (100 mLs Intravenous Contrast Given 12/10/19 0854)     ED Discharge Orders         Ordered    ciprofloxacin (CIPRO) 500 MG tablet  2 times daily     12/10/19 0939    metroNIDAZOLE (FLAGYL) 500 MG tablet  3 times daily     12/10/19 0939    ondansetron (ZOFRAN ODT) 4 MG disintegrating tablet  Every 8 hours PRN     12/10/19 0939    oxyCODONE (ROXICODONE) 5 MG immediate release tablet  Every 6 hours PRN     12/10/19 0932           Note:  This document was prepared using Dragon voice recognition software and may include unintentional dictation errors.   Vanessa Emlenton, MD 12/10/19 [phone]

## 2019-12-11 LAB — URINE CULTURE: Culture: NO GROWTH

## 2020-02-25 ENCOUNTER — Ambulatory Visit: Payer: Self-pay | Attending: Internal Medicine

## 2020-02-25 ENCOUNTER — Other Ambulatory Visit: Payer: Self-pay

## 2020-02-25 DIAGNOSIS — Z23 Encounter for immunization: Secondary | ICD-10-CM

## 2020-02-25 NOTE — Progress Notes (Signed)
   Covid-19 Vaccination Clinic  Name:  Avontae Burkhead    MRN: [id number] DOB: 1966-09-20  02/25/2020  Mr. Holland was observed post Covid-19 immunization for 30 minutes based on pre-vaccination screening without incident. He was provided with Vaccine Information Sheet and instruction to access the V-Safe system.   Mr. Thum was instructed to call 911 with any severe reactions post vaccine: Marland Kitchen Difficulty breathing  . Swelling of face and throat  . A fast heartbeat  . A bad rash all over body  . Dizziness and weakness   Immunizations Administered    Name Date Dose VIS Date Route   Pfizer COVID-19 Vaccine 02/25/2020 10:25 AM 0.3 mL 01/02/2019 Intramuscular   Manufacturer: [employer], [employer]   Lot: [id number]   NDC: [address]-5

## 2020-03-22 ENCOUNTER — Ambulatory Visit: Payer: Self-pay | Attending: Internal Medicine

## 2020-03-22 DIAGNOSIS — Z23 Encounter for immunization: Secondary | ICD-10-CM

## 2020-03-22 NOTE — Progress Notes (Signed)
   Covid-19 Vaccination Clinic  Name:  Vincent Morton    MRN: [id number] DOB: 12-Sep-1966  03/22/2020  [name] was observed post Covid-19 immunization for 15 minutes without incident. He was provided with Vaccine Information Sheet and instruction to access the V-Safe system.   [name] was instructed to call 911 with any severe reactions post vaccine: Marland Kitchen Difficulty breathing  . Swelling of face and throat  . A fast heartbeat  . A bad rash all over body  . Dizziness and weakness   Immunizations Administered    Name Date Dose VIS Date Route   Pfizer COVID-19 Vaccine 03/22/2020 10:22 AM 0.3 mL 01/02/2019 Intramuscular   Manufacturer: [employer], [employer]   Lot: [id number]   NDC: [address]-8

## 2020-04-14 ENCOUNTER — Emergency Department: Payer: Self-pay

## 2020-04-14 ENCOUNTER — Other Ambulatory Visit: Payer: Self-pay

## 2020-04-14 ENCOUNTER — Emergency Department
Admission: EM | Admit: 2020-04-14 | Discharge: 2020-04-14 | Disposition: A | Discharge status: Home / Self Care | Payer: Self-pay | Attending: Student in an Organized Health Care Education/Training Program | Admitting: Student in an Organized Health Care Education/Training Program

## 2020-04-14 DIAGNOSIS — F17210 Nicotine dependence, cigarettes, uncomplicated: Secondary | ICD-10-CM | POA: Insufficient documentation

## 2020-04-14 DIAGNOSIS — E119 Type 2 diabetes mellitus without complications: Secondary | ICD-10-CM | POA: Insufficient documentation

## 2020-04-14 DIAGNOSIS — Z7984 Long term (current) use of oral hypoglycemic drugs: Secondary | ICD-10-CM | POA: Insufficient documentation

## 2020-04-14 DIAGNOSIS — N3000 Acute cystitis without hematuria: Secondary | ICD-10-CM | POA: Insufficient documentation

## 2020-04-14 LAB — BASIC METABOLIC PANEL
Anion gap: 10 (ref 5–15)
BUN: 10 mg/dL (ref 6–20)
CO2: 23 mmol/L (ref 22–32)
Calcium: 9 mg/dL (ref 8.9–10.3)
Chloride: 101 mmol/L (ref 98–111)
Creatinine, Ser: 1.16 mg/dL (ref 0.61–1.24)
GFR calc Af Amer: >60 mL/min (ref >60)
GFR calc non Af Amer: >60 mL/min (ref >60)
Glucose, Bld: 204 mg/dL — ABNORMAL HIGH (ref 70–99)
Potassium: 4 mmol/L (ref 3.5–5.1)
Sodium: 134 mmol/L — ABNORMAL LOW (ref 135–145)

## 2020-04-14 LAB — URINALYSIS, COMPLETE (UACMP) WITH MICROSCOPIC
Bilirubin Urine: NEGATIVE
Glucose, UA: >=500 mg/dL — AB
Hgb urine dipstick: NEGATIVE
Ketones, ur: NEGATIVE mg/dL
Nitrite: NEGATIVE
Protein, ur: NEGATIVE mg/dL
Specific Gravity, Urine: 1.007 (ref 1.005–1.030)
Squamous Epithelial / HPF: NONE SEEN (ref 0–5)
pH: 5 (ref 5.0–8.0)

## 2020-04-14 LAB — CBC
HCT: 46.1 % (ref 39.0–52.0)
Hemoglobin: 15.5 g/dL (ref 13.0–17.0)
MCH: 28.3 pg (ref 26.0–34.0)
MCHC: 33.6 g/dL (ref 30.0–36.0)
MCV: 84.3 fL (ref 80.0–100.0)
Platelets: 183 10*3/uL (ref 150–400)
RBC: 5.47 MIL/uL (ref 4.22–5.81)
RDW: 12.9 % (ref 11.5–15.5)
WBC: 10.2 10*3/uL (ref 4.0–10.5)
nRBC: 0 % (ref 0.0–0.2)

## 2020-04-14 MED ORDER — SULFAMETHOXAZOLE-TRIMETHOPRIM 800-160 MG PO TABS
1.0000 | ORAL_TABLET | Freq: Once | ORAL | Status: AC
  Administered 2020-04-14: 1 via ORAL

## 2020-04-14 MED ORDER — SULFAMETHOXAZOLE-TRIMETHOPRIM 800-160 MG PO TABS: 1.0000 | ORAL_TABLET | Freq: Two times a day (BID) | ORAL | 0 refills | Status: DC

## 2020-04-14 NOTE — ED Notes (Signed)
See triage note  Presents with diff urinating states he is able to pass urine but is having some burning and irritation  After the stream

## 2020-04-14 NOTE — ED Notes (Signed)
Bladder Scan showed 89ml in bladder.

## 2020-04-14 NOTE — ED Triage Notes (Signed)
Patient presents to the ED with difficulty urinating.  Patient states he has had frequency and very halting, "trickling" urination.  Patient states he has had UTIs and Yeast infections in the past related to his diabetes.  Patient states he does not check his blood sugar regularly and is unsure of what his blood sugar is today.  Patient states on Saturday/Sunday he had body aches, fever, loss of taste and loss of smell but those symptoms have now resolved.  Patient states he is fully vaccinated with pfizer vaccine (2nd vaccine was 3 weeks ago).

## 2020-04-14 NOTE — ED Provider Notes (Signed)
[facility] Emergency Department Provider Note  ____________________________________________  Time seen: Approximately 9:39 AM  I have reviewed the triage vital signs and the nursing notes.   HISTORY  Chief Complaint Urinary Retention    HPI Vincent Morton is a 54 y.o. male that presents to the emergency department for evaluation of a slow stream of urine and dysuria following urination for 2 days.  Patient states that he has had intermittent urinary tract infections since becoming prediabetic.  He states that he will then get put on antibiotics, which will give him a yeast infection.  He has had 2 doses of the Pfizer vaccine, last one being 3 weeks ago.  Over the weekend, he did have some body aches and loss of taste and smell.  All of the symptoms have resolved.  He works at [employer] and is around a lot of people.  No shortness breath, chest pain, abdominal pain.   Past Medical History:  Diagnosis Date  . Diabetes mellitus without complication (HCC)     There are no problems to display for this patient.   No past surgical history on file.  Prior to Admission medications   Medication Sig Start Date End Date Taking? Authorizing Provider  acetaminophen (TYLENOL) 500 MG tablet Take 500 mg by mouth as needed.    [provider]  ibuprofen (ADVIL) 800 MG tablet Take 1 tablet (800 mg total) by mouth every 8 (eight) hours as needed. 06/18/19   Emily Filbert, MD  metFORMIN (GLUCOPHAGE-XR) 500 MG 24 hr tablet Take 1,000 mg by mouth daily with breakfast. 11/22/17   [provider]  sulfamethoxazole-trimethoprim (BACTRIM DS) 800-160 MG tablet Take 1 tablet by mouth 2 (two) times daily. 04/14/20   Enid Derry, PA-C    Allergies Patient has no known allergies.  No family history on file.  Social History Social History   Tobacco Use  . Smoking status: Current Every Day Smoker    Types: Cigarettes  . Smokeless tobacco: Never Used   Substance Use Topics  . Alcohol use: Not Currently    Comment: Last drink 33yrs  . Drug use: Yes    Types: Marijuana     Review of Systems  Constitutional: No fever/chills Respiratory: No SOB. Gastrointestinal: No abdominal pain.  No nausea, no vomiting.  Genitourinary: Positive for dysuria and slow urination. Musculoskeletal: Negative for musculoskeletal pain. Skin: Negative for rash, abrasions, lacerations, ecchymosis. Neurological: Negative for headaches, numbness or tingling   ____________________________________________   PHYSICAL EXAM:  VITAL SIGNS: ED Triage Vitals  Enc Vitals Group     BP 04/14/20 0746 136/76     Pulse Rate 04/14/20 0746 70     Resp 04/14/20 0746 16     Temp 04/14/20 0746 98.7 F (37.1 C)     Temp Source 04/14/20 0746 Oral     SpO2 04/14/20 0746 97 %     Weight 04/14/20 0744 170 lb (77.1 kg)     Height 04/14/20 0744 5\' 8"  (1.727 m)     Head Circumference --      Peak Flow --      Pain Score 04/14/20 0744 7     Pain Loc --      Pain Edu? --      Excl. in GC? --      Constitutional: Alert and oriented. Well appearing and in no acute distress. Eyes: Conjunctivae are normal. PERRL. EOMI. Head: Atraumatic. ENT:      Ears:  Nose: No congestion/rhinnorhea.      Mouth/Throat: Mucous membranes are moist.  Neck: No stridor.  Cardiovascular: Normal rate, regular rhythm.  Good peripheral circulation. Respiratory: Normal respiratory effort without tachypnea or retractions. Lungs CTAB. Good air entry to the bases with no decreased or absent breath sounds. Gastrointestinal: Bowel sounds 4 quadrants. Soft and nontender to palpation. No guarding or rigidity. No palpable masses. No distention.  Musculoskeletal: Full range of motion to all extremities. No gross deformities appreciated. Neurologic:  Normal speech and language. No gross focal neurologic deficits are appreciated.  Skin:  Skin is warm, dry and intact. No rash noted. Psychiatric: Mood  and affect are normal. Speech and behavior are normal. Patient exhibits appropriate insight and judgement.   ____________________________________________   LABS (all labs ordered are listed, but only abnormal results are displayed)  Labs Reviewed  URINALYSIS, COMPLETE (UACMP) WITH MICROSCOPIC - Abnormal; Notable for the following components:      Result Value   Color, Urine YELLOW (*)    APPearance CLEAR (*)    Glucose, UA >=500 (*)    Leukocytes,Ua TRACE (*)    Bacteria, UA MANY (*)    All other components within normal limits  BASIC METABOLIC PANEL - Abnormal; Notable for the following components:   Sodium 134 (*)    Glucose, Bld 204 (*)    All other components within normal limits  CBC   ____________________________________________  EKG   ____________________________________________  RADIOLOGY Robinette Haines, personally viewed and evaluated these images (plain radiographs) as part of my medical decision making, as well as reviewing the written report by the radiologist.  CT Renal Stone Study  Result Date: 04/14/2020 CLINICAL DATA:  54 year old male with history of urinary retention. Urinary frequency. EXAM: CT ABDOMEN AND PELVIS WITHOUT CONTRAST TECHNIQUE: Multidetector CT imaging of the abdomen and pelvis was performed following the standard protocol without IV contrast. COMPARISON:  CT the abdomen and pelvis 03/04/2019. FINDINGS: Lower chest: Unremarkable. Hepatobiliary: No definite suspicious cystic or solid hepatic lesions are confidently identified on today's noncontrast CT examination. Unenhanced appearance of the gallbladder is normal. Pancreas: No definite pancreatic mass or peripancreatic fluid collections or inflammatory changes are noted on today's noncontrast CT examination. Spleen: Unremarkable. Adrenals/Urinary Tract: There are no abnormal calcifications within the collecting system of either kidney, along the course of either ureter, or within the lumen of the  urinary bladder. No hydroureteronephrosis or perinephric stranding to suggest urinary tract obstruction at this time. Multiple low-attenuation lesions are noted in both kidneys, incompletely characterized on today's non-contrast CT examination, but statistically likely to represent cysts, largest of which measures 1.7 cm in diameter in the medial aspect of the upper pole the left kidney. Unenhanced appearance of the urinary bladder is unremarkable. Bilateral adrenal glands are normal in appearance. Stomach/Bowel: Unenhanced appearance of the stomach is normal. No pathologic dilatation of small bowel or colon. Numerous colonic diverticulae are noted, particularly in the descending colon and sigmoid colon, without surrounding inflammatory changes to suggest an acute diverticulitis at this time. Normal appendix. Vascular/Lymphatic: No atherosclerotic calcifications in the abdominal aorta or pelvic vasculature. No lymphadenopathy noted in the abdomen or pelvis. Reproductive: Prostate gland and seminal vesicles are unremarkable in appearance. Other: No significant volume of ascites.  No pneumoperitoneum. Musculoskeletal: There are no aggressive appearing lytic or blastic lesions noted in the visualized portions of the skeleton. IMPRESSION: 1. No acute findings are noted in the abdomen or pelvis to account for the patient's symptoms. Specifically, no urinary tract calculi  no findings of urinary tract obstruction are noted at this time. 2. Numerous colonic diverticulae are noted, 3. Colonic diverticulosis without evidence of acute diverticulitis at this time. 4. Low-attenuation lesions in the kidneys bilaterally, incompletely characterized on today's non-contrast CT examination, but previously characterized as simple cysts. Electronically Signed   By: Trudie Reed M.D.   On: 04/14/2020 10:28    ____________________________________________    PROCEDURES  Procedure(s) performed:     Procedures    Medications  sulfamethoxazole-trimethoprim (BACTRIM DS) 800-160 MG per tablet 1 tablet (1 tablet Oral Given 04/14/20 1143)     ____________________________________________   INITIAL IMPRESSION / ASSESSMENT AND PLAN / ED COURSE  Pertinent labs & imaging results that were available during my care of the patient were reviewed by me and considered in my medical decision making (see chart for details).  Review of the [place] CSRS was performed in accordance of the [facility] prior to dispensing any controlled drugs.  Patient's diagnosis is consistent with UTI.  Vital signs and exam are reassuring.  Urinalysis consistent with an infection.  CT scan negative for nephrolithiasis.  CT findings were discussed with the patient.  Bladder scan 3 mL post void.  Overall patient appears well and is very talkative.  Covid test is pending.  Patient will be discharged home with prescriptions for Bactrim. Patient is to follow up with urology as directed. Patient is given ED precautions to return to the ED for any worsening or new symptoms.  Vincent Morton was evaluated in Emergency Department on 04/14/2020 for the symptoms described in the history of present illness. He was evaluated in the context of the global COVID-19 pandemic, which necessitated consideration that the patient might be at risk for infection with the SARS-CoV-2 virus that causes COVID-19. Institutional protocols and algorithms that pertain to the evaluation of patients at risk for COVID-19 are in a state of rapid change based on information released by regulatory bodies including the CDC and federal and state organizations. These policies and algorithms were followed during the patient's care in the ED.   ____________________________________________  FINAL CLINICAL IMPRESSION(S) / ED DIAGNOSES  Final diagnoses:  Acute cystitis without hematuria      NEW MEDICATIONS STARTED DURING THIS VISIT:  ED Discharge Orders          Ordered    sulfamethoxazole-trimethoprim (BACTRIM DS) 800-160 MG tablet  2 times daily     04/14/20 1131              This chart was dictated using voice recognition software/Dragon. Despite best efforts to proofread, errors can occur which can change the meaning. Any change was purely unintentional.    Enid Derry, PA-C 04/14/20 1251    Willy Eddy, MD 04/14/20 1356

## 2020-04-24 ENCOUNTER — Emergency Department: Payer: Self-pay

## 2020-04-24 ENCOUNTER — Other Ambulatory Visit: Payer: Self-pay

## 2020-04-24 ENCOUNTER — Emergency Department
Admission: EM | Admit: 2020-04-24 | Discharge: 2020-04-24 | Disposition: A | Discharge status: Home / Self Care | Payer: Self-pay | Attending: Student | Admitting: Student

## 2020-04-24 ENCOUNTER — Encounter: Payer: Self-pay | Admitting: Emergency Medicine

## 2020-04-24 ENCOUNTER — Ambulatory Visit: Payer: Self-pay | Admitting: Urology

## 2020-04-24 DIAGNOSIS — R10A1 Flank pain, right side: Secondary | ICD-10-CM

## 2020-04-24 DIAGNOSIS — R109 Unspecified abdominal pain: Secondary | ICD-10-CM

## 2020-04-24 DIAGNOSIS — R1031 Right lower quadrant pain: Secondary | ICD-10-CM | POA: Insufficient documentation

## 2020-04-24 DIAGNOSIS — E119 Type 2 diabetes mellitus without complications: Secondary | ICD-10-CM | POA: Insufficient documentation

## 2020-04-24 DIAGNOSIS — N50811 Right testicular pain: Secondary | ICD-10-CM

## 2020-04-24 DIAGNOSIS — Z7984 Long term (current) use of oral hypoglycemic drugs: Secondary | ICD-10-CM | POA: Insufficient documentation

## 2020-04-24 DIAGNOSIS — F17210 Nicotine dependence, cigarettes, uncomplicated: Secondary | ICD-10-CM | POA: Insufficient documentation

## 2020-04-24 LAB — URINALYSIS, COMPLETE (UACMP) WITH MICROSCOPIC
Bilirubin Urine: NEGATIVE
Glucose, UA: 150 mg/dL — AB
Hgb urine dipstick: NEGATIVE
Ketones, ur: NEGATIVE mg/dL
Nitrite: POSITIVE — AB
Protein, ur: 30 mg/dL — AB
Specific Gravity, Urine: 1.025 (ref 1.005–1.030)
Squamous Epithelial / HPF: NONE SEEN (ref 0–5)
pH: 5 (ref 5.0–8.0)

## 2020-04-24 MED ORDER — FLUCONAZOLE 50 MG PO TABS
150.0000 mg | ORAL_TABLET | Freq: Once | ORAL | Status: AC
  Administered 2020-04-24: 150 mg via ORAL
  Filled 2020-04-24: qty 1

## 2020-04-24 MED ORDER — LEVOFLOXACIN 250 MG PO TABS: 250.0000 mg | ORAL_TABLET | Freq: Every day | ORAL | 0 refills | Status: DC

## 2020-04-24 MED ORDER — HYDROCODONE-ACETAMINOPHEN 5-325 MG PO TABS: 1.0000 | ORAL_TABLET | Freq: Three times a day (TID) | ORAL | 0 refills | Status: AC | PRN

## 2020-04-24 MED ORDER — HYDROCODONE-ACETAMINOPHEN 5-325 MG PO TABS
1.0000 | ORAL_TABLET | Freq: Once | ORAL | Status: AC
  Administered 2020-04-24: 1 via ORAL
  Filled 2020-04-24: qty 1

## 2020-04-24 MED ORDER — LEVOFLOXACIN 500 MG PO TABS
250.0000 mg | ORAL_TABLET | Freq: Once | ORAL | Status: AC
  Administered 2020-04-24: 250 mg via ORAL
  Filled 2020-04-24: qty 1

## 2020-04-24 MED ORDER — FLUCONAZOLE 150 MG PO TABS: 150.0000 mg | ORAL_TABLET | Freq: Once | ORAL | 0 refills | Status: AC

## 2020-04-24 NOTE — ED Triage Notes (Signed)
Patient presents to the ED with right sided flank pain and right sided pelvic pain.  Patient states he was seen last week and was told he had a cyst on his kidney.  Patient states he was also diagnosed with a UTI and given an antibiotic.  Patient states he took his antibiotics for a couple days and then forgot his antibiotics for several days because he was feeling better.

## 2020-04-24 NOTE — Discharge Instructions (Addendum)
You are being treated for a UTI. Take the antibiotic as directed. Follow-up with Dr. Richardo Hanks, this afternoon, as scheduled. Return to the ED for urinary retention, fevers, or gross hematuria.

## 2020-04-24 NOTE — ED Notes (Signed)
See triage note  Presents with right flank and groin pain  States pain started back again yesterday  Was seen for same about 1 week ago    States he only took 2 days of his antibiotics  Then stopped

## 2020-04-24 NOTE — ED Provider Notes (Addendum)
[facility] Emergency Department Provider Note ____________________________________________  Time seen: 1012  I have reviewed the triage vital signs and the nursing notes.  HISTORY  Chief Complaint  Flank Pain  HPI Vincent Morton is a 54 y.o. male returns to the ED, accompanied by his wife, for evaluation of right-sided flank pain and right-sided pelvic pain.  Patient was evaluated a week earlier and was treated for a UTI.  A CT renal study not revealing any acute renal stones but he did have some benign renal cyst.  Patient admittedly started on the Bactrim as prescribed, but discontinued the course after about 3 days, citing he was feeling better.  He presents today with some flank pain, some subjective fevers, and chills.  He also continues to endorse right flank pain.  He denies any gross hematuria, urinary retention, or urinary frequency.  He does report some mild inguinal pain on the right as well.  He denies any penile discharge or penile lesions.   Past Medical History:  Diagnosis Date  . Diabetes mellitus without complication ([place])     There are no problems to display for this patient.   History reviewed. No pertinent surgical history.  Prior to Admission medications   Medication Sig Start Date End Date Taking? Authorizing Provider  acetaminophen (TYLENOL) 500 MG tablet Take 500 mg by mouth as needed.    [provider]  fluconazole (DIFLUCAN) 150 MG tablet Take 1 tablet (150 mg total) by mouth once for 1 dose. 04/24/20 04/24/20  Addylynn Balin, Dannielle Karvonen, PA-C  HYDROcodone-acetaminophen (NORCO) 5-325 MG tablet Take 1 tablet by mouth 3 (three) times daily as needed for up to 2 days. 04/24/20 04/26/20  Lovie Zarling, Dannielle Karvonen, PA-C  ibuprofen (ADVIL) 800 MG tablet Take 1 tablet (800 mg total) by mouth every 8 (eight) hours as needed. 06/18/19   Earleen [place], MD  levofloxacin (LEVAQUIN) 250 MG tablet Take 1 tablet (250 mg total) by mouth daily for  4 days. 04/25/20 04/29/20  Jillyn Stacey, Dannielle Karvonen, PA-C  metFORMIN (GLUCOPHAGE-XR) 500 MG 24 hr tablet Take 1,000 mg by mouth daily with breakfast. 11/22/17   [provider]  sulfamethoxazole-trimethoprim (BACTRIM DS) 800-160 MG tablet Take 1 tablet by mouth 2 (two) times daily. 04/14/20   Laban Emperor, PA-C    Allergies Patient has no known allergies.  History reviewed. No pertinent family history.  Social History Social History   Tobacco Use  . Smoking status: Current Every Day Smoker    Types: Cigarettes  . Smokeless tobacco: Never Used  Substance Use Topics  . Alcohol use: Not Currently    Comment: Last drink 75yrs  . Drug use: Yes    Types: Marijuana    Review of Systems  Constitutional: Negative for fever. Cardiovascular: Negative for chest pain. Respiratory: Negative for shortness of breath. Gastrointestinal: Negative for abdominal pain, vomiting and diarrhea. Genitourinary: Positive for dysuria. Musculoskeletal: Negative for back pain. Skin: Negative for rash. Neurological: Negative for headaches, focal weakness or numbness. ____________________________________________  PHYSICAL EXAM:  VITAL SIGNS: ED Triage Vitals  Enc Vitals Group     BP 04/24/20 0938 127/74     Pulse Rate 04/24/20 0938 80     Resp 04/24/20 0938 20     Temp 04/24/20 0938 98.5 F (36.9 C)     Temp Source 04/24/20 0938 Oral     SpO2 04/24/20 0938 98 %     Weight 04/24/20 0938 165 lb (74.8 kg)     Height  04/24/20 0938 5\' 8"  (1.727 m)     Head Circumference --      Peak Flow --      Pain Score 04/24/20 0948 8     Pain Loc --      Pain Edu? --      Excl. in GC? --     Constitutional: Alert and oriented. Well appearing and in no distress. Head: Normocephalic and atraumatic. Eyes: Conjunctivae are normal. Normal extraocular movements Cardiovascular: Normal rate, regular rhythm. Normal distal pulses. Respiratory: Normal respiratory effort. No  wheezes/rales/rhonchi. Gastrointestinal: Soft and nontender. No distention. Mild right flank tenderness GU: mildly tender to palp over the right inguinal groove Musculoskeletal: Nontender with normal range of motion in all extremities.  Neurologic:  Normal gait without ataxia. Normal speech and language. No gross focal neurologic deficits are appreciated. Skin:  Skin is warm, dry and intact. No rash noted. Psychiatric: Mood and affect are normal. Patient exhibits appropriate insight and judgment. ____________________________________________   LABS (pertinent positives/negatives) Labs Reviewed  URINALYSIS, COMPLETE (UACMP) WITH MICROSCOPIC - Abnormal; Notable for the following components:      Result Value   Color, Urine YELLOW (*)    APPearance HAZY (*)    Glucose, UA 150 (*)    Protein, ur 30 (*)    Nitrite POSITIVE (*)    Leukocytes,Ua TRACE (*)    Bacteria, UA FEW (*)    All other components within normal limits  URINE CULTURE  ____________________________________________   RADIOLOGY  04/26/20 [place] w/ Doppler  IMPRESSION: Negative for testicular torsion or intratesticular mass. ____________________________________________  PROCEDURES  Diflucan 150 mg PO Levofloxacin 250 mg PO Hydrocodone 5-325 mg PO  Procedures ____________________________________________  INITIAL IMPRESSION / ASSESSMENT AND PLAN / ED COURSE  Differential diagnosis includes, but is not limited to, acute appendicitis, renal colic, testicular torsion, urinary tract infection/pyelonephritis, prostatitis,  epididymitis, diverticulitis, small bowel obstruction or ileus, colitis, abdominal aortic aneurysm, gastroenteritis, hernia, etc.  Patient with ED evaluation of right flank pain and right pelvic pain. He is evaluated in the ED 1 week prior, with a CT scan renal study, that did not reveal any acute renal calculi. He did have some benign renal cyst. He was treated empirically with Bactrim for UTI, and  admittedly was noncompliant with the antibiotic course. Patient took 2 days of antibiotics and then discontinued the course. He presents today with increased flank pain, malaise, and subjective fevers. He did restart the antibiotic yesterday. Urinalysis reveals nitrite positive urine with leukocyturia. Patient will be started on Levaquin and a urine culture is pending at this time. He will follow-up with urology as planned, this afternoon. Return precautions have been reviewed.  Vincent Morton was evaluated in Emergency Department on 04/24/2020 for the symptoms described in the history of present illness. He was evaluated in the context of the global COVID-19 pandemic, which necessitated consideration that the patient might be at risk for infection with the SARS-CoV-2 virus that causes COVID-19. Institutional protocols and algorithms that pertain to the evaluation of patients at risk for COVID-19 are in a state of rapid change based on information released by regulatory bodies including the CDC and federal and state organizations. These policies and algorithms were followed during the patient's care in the ED.  I reviewed the patient's prescription history over the last 12 months in the multi-state controlled substances database(s) that includes [place], [place], [place], [place], [place], [place], [place], [place], [place], [place], [place], [place], [place], and [place].  Results were  notable for no current RX. ____________________________________________  FINAL CLINICAL IMPRESSION(S) / ED DIAGNOSES  Final diagnoses:  Pain in right testicle  Right flank pain      Ajeet Casasola, Charlesetta Ivory, PA-C 04/24/20 1449    Myeasha Ballowe, Charlesetta Ivory, PA-C 04/24/20 1449    Miguel Aschoff., MD 04/24/20 1540

## 2020-04-26 ENCOUNTER — Other Ambulatory Visit: Payer: Self-pay

## 2020-04-26 ENCOUNTER — Emergency Department
Admission: EM | Admit: 2020-04-26 | Discharge: 2020-04-26 | Disposition: A | Discharge status: Home / Self Care | Payer: Self-pay | Attending: Emergency Medicine | Admitting: Emergency Medicine

## 2020-04-26 ENCOUNTER — Encounter: Payer: Self-pay | Admitting: Emergency Medicine

## 2020-04-26 DIAGNOSIS — N390 Urinary tract infection, site not specified: Secondary | ICD-10-CM

## 2020-04-26 DIAGNOSIS — F17210 Nicotine dependence, cigarettes, uncomplicated: Secondary | ICD-10-CM | POA: Insufficient documentation

## 2020-04-26 DIAGNOSIS — E119 Type 2 diabetes mellitus without complications: Secondary | ICD-10-CM | POA: Insufficient documentation

## 2020-04-26 DIAGNOSIS — Z7984 Long term (current) use of oral hypoglycemic drugs: Secondary | ICD-10-CM | POA: Insufficient documentation

## 2020-04-26 DIAGNOSIS — Z79899 Other long term (current) drug therapy: Secondary | ICD-10-CM | POA: Insufficient documentation

## 2020-04-26 LAB — CBC
HCT: 42.1 % (ref 39.0–52.0)
Hemoglobin: 14.3 g/dL (ref 13.0–17.0)
MCH: 28.3 pg (ref 26.0–34.0)
MCHC: 34 g/dL (ref 30.0–36.0)
MCV: 83.2 fL (ref 80.0–100.0)
Platelets: 205 10*3/uL (ref 150–400)
RBC: 5.06 MIL/uL (ref 4.22–5.81)
RDW: 13.2 % (ref 11.5–15.5)
WBC: 9.6 10*3/uL (ref 4.0–10.5)
nRBC: 0 % (ref 0.0–0.2)

## 2020-04-26 LAB — BASIC METABOLIC PANEL
Anion gap: 9 (ref 5–15)
BUN: 15 mg/dL (ref 6–20)
CO2: 22 mmol/L (ref 22–32)
Calcium: 9.7 mg/dL (ref 8.9–10.3)
Chloride: 105 mmol/L (ref 98–111)
Creatinine, Ser: 1.14 mg/dL (ref 0.61–1.24)
GFR calc Af Amer: >60 mL/min (ref >60)
GFR calc non Af Amer: >60 mL/min (ref >60)
Glucose, Bld: 156 mg/dL — ABNORMAL HIGH (ref 70–99)
Potassium: 4.3 mmol/L (ref 3.5–5.1)
Sodium: 136 mmol/L (ref 135–145)

## 2020-04-26 LAB — URINALYSIS, COMPLETE (UACMP) WITH MICROSCOPIC
Bacteria, UA: NONE SEEN
Bilirubin Urine: NEGATIVE
Glucose, UA: NEGATIVE mg/dL
Ketones, ur: NEGATIVE mg/dL
Nitrite: NEGATIVE
Protein, ur: 30 mg/dL — AB
RBC / HPF: >50 RBC/hpf — ABNORMAL HIGH (ref 0–5)
Specific Gravity, Urine: 1.021 (ref 1.005–1.030)
Squamous Epithelial / HPF: NONE SEEN (ref 0–5)
WBC, UA: >50 WBC/hpf — ABNORMAL HIGH (ref 0–5)
pH: 5 (ref 5.0–8.0)

## 2020-04-26 LAB — URINE CULTURE: Culture: >=100000 — AB

## 2020-04-26 MED ORDER — LEVOFLOXACIN 750 MG PO TABS: 750.0000 mg | ORAL_TABLET | Freq: Every day | ORAL | 0 refills | Status: AC

## 2020-04-26 NOTE — ED Triage Notes (Signed)
Pt presents to ED via POV with c/o pelvic pain and flank pain. Pt states seen Thursday and given a different abx for UTI. Pt states was told if he had blood in urine or nausea to come back. Pt states last night noted old blood in his urine, this morning saw bright red blood in his urine. Pt c/o continued pain at this time.

## 2020-04-26 NOTE — ED Provider Notes (Signed)
[facility] Emergency Department Provider Note   ____________________________________________    I have reviewed the triage vital signs and the nursing notes.   HISTORY  Chief Complaint Hematuria    HPI Vincent Morton is a 54 y.o. male with relatively new diagnosis of diabetes who presents with hematuria and continued discomfort with urination.  Patient originally was diagnosed with UTI last week, only took Bactrim for a couple of days before he felt better so he stopped the medication.  Was seen here recently and started on Levaquin, culture sent.  Patient reports he noticed hematuria today so presented for evaluation.  Denies fevers or chills.  No nausea or vomiting.  Past Medical History:  Diagnosis Date  . Diabetes mellitus without complication (HCC)     There are no problems to display for this patient.   History reviewed. No pertinent surgical history.  Prior to Admission medications   Medication Sig Start Date End Date Taking? Authorizing Provider  acetaminophen (TYLENOL) 500 MG tablet Take 500 mg by mouth as needed.    [provider]  HYDROcodone-acetaminophen (NORCO) 5-325 MG tablet Take 1 tablet by mouth 3 (three) times daily as needed for up to 2 days. 04/24/20 04/26/20  Menshew, Charlesetta Ivory, PA-C  ibuprofen (ADVIL) 800 MG tablet Take 1 tablet (800 mg total) by mouth every 8 (eight) hours as needed. 06/18/19   Emily Filbert, MD  levofloxacin (LEVAQUIN) 750 MG tablet Take 1 tablet (750 mg total) by mouth daily for 7 days. 04/26/20 05/03/20  Jene Every, MD  metFORMIN (GLUCOPHAGE-XR) 500 MG 24 hr tablet Take 1,000 mg by mouth daily with breakfast. 11/22/17   [provider]  sulfamethoxazole-trimethoprim (BACTRIM DS) 800-160 MG tablet Take 1 tablet by mouth 2 (two) times daily. 04/14/20   Enid Derry, PA-C     Allergies Patient has no known allergies.  No family history on file.  Social History Social History    Tobacco Use  . Smoking status: Current Every Day Smoker    Types: Cigarettes  . Smokeless tobacco: Never Used  Substance Use Topics  . Alcohol use: Not Currently    Comment: Last drink 55yrs  . Drug use: Yes    Types: Marijuana    Review of Systems  Constitutional: No fever/chills Eyes: No visual changes.  ENT: No sore throat. Cardiovascular: Denies chest pain. Respiratory: Denies shortness of breath. Gastrointestinal: As above Genitourinary: As above Musculoskeletal: Negative for back pain. Skin: Negative for rash. Neurological: Negative for headaches or weakness   ____________________________________________   PHYSICAL EXAM:  VITAL SIGNS: ED Triage Vitals  Enc Vitals Group     BP 04/26/20 1014 129/82     Pulse Rate 04/26/20 1014 76     Resp 04/26/20 1014 18     Temp 04/26/20 1014 98.7 F (37.1 C)     Temp Source 04/26/20 1014 Oral     SpO2 04/26/20 1014 99 %     Weight 04/26/20 1015 74.8 kg (165 lb)     Height 04/26/20 1015 1.727 m (5\' 8" )     Head Circumference --      Peak Flow --      Pain Score 04/26/20 1015 9     Pain Loc --      Pain Edu? --      Excl. in GC? --     Constitutional: Alert and oriented. No acute distress.  Eyes: Conjunctivae are normal.   Nose: No congestion/rhinnorhea.  Cardiovascular: Normal  rate, regular rhythm.  Good peripheral circulation. Respiratory: Normal respiratory effort.  No retractions.  Gastrointestinal: Soft and nontender. No distention.  No CVA tenderness.  Musculoskeletal: No lower extremity tenderness nor edema.  Warm and well perfused Neurologic:  Normal speech and language. No gross focal neurologic deficits are appreciated.  Skin:  Skin is warm, dry and intact. No rash noted. Psychiatric: Mood and affect are normal. Speech and behavior are normal.  ____________________________________________   LABS (all labs ordered are listed, but only abnormal results are displayed)  Labs Reviewed  URINALYSIS,  COMPLETE (UACMP) WITH MICROSCOPIC - Abnormal; Notable for the following components:      Result Value   Color, Urine YELLOW (*)    APPearance CLOUDY (*)    Hgb urine dipstick MODERATE (*)    Protein, ur 30 (*)    Leukocytes,Ua LARGE (*)    RBC / HPF >50 (*)    WBC, UA >50 (*)    All other components within normal limits  BASIC METABOLIC PANEL - Abnormal; Notable for the following components:   Glucose, Bld 156 (*)    All other components within normal limits  CBC   ____________________________________________  EKG  None ____________________________________________  RADIOLOGY  None ____________________________________________   PROCEDURES  Procedure(s) performed: No  Procedures   Critical Care performed: No ____________________________________________   INITIAL IMPRESSION / ASSESSMENT AND PLAN / ED COURSE  Pertinent labs & imaging results that were available during my care of the patient were reviewed by me and considered in my medical decision making (see chart for details).  Culture data demonstrates that the Levaquin should be effective against urinary tract infection, have increased dosage and extended time of antibiotics.  Lab work today is quite reassuring, white blood cell count is normal.  Electrolytes are normal.  Urinalysis consistent with continued urinary tract infection.  Return precautions discussed    ____________________________________________   FINAL CLINICAL IMPRESSION(S) / ED DIAGNOSES  Final diagnoses:  Lower urinary tract infectious disease        Note:  This document was prepared using Dragon voice recognition software and may include unintentional dictation errors.   [name], MD 04/26/20 1535

## 2020-05-19 ENCOUNTER — Other Ambulatory Visit: Payer: Self-pay | Admitting: *Deleted

## 2020-05-19 DIAGNOSIS — R339 Retention of urine, unspecified: Secondary | ICD-10-CM

## 2020-05-20 ENCOUNTER — Ambulatory Visit: Payer: Self-pay | Admitting: Urology

## 2020-08-25 IMAGING — CT CT RENAL STONE PROTOCOL
2 of 4 series · 15 of 46 positions shown, 17 images · non-contrast
Comparison: CT the abdomen and pelvis 03/04/2019.

CLINICAL DATA: 54-year-old male with history of urinary retention.
Urinary frequency.

EXAM:
CT ABDOMEN AND PELVIS WITHOUT CONTRAST
TECHNIQUE: Multidetector CT imaging of the abdomen and pelvis was performed
following the standard protocol without IV contrast.

[Series 2: stone full standard · axial · 0.68mm/px · z∈[-515,-95]mm · 12 of 92 slices shown, 14 images]
[im 4/92  soft-tissue]
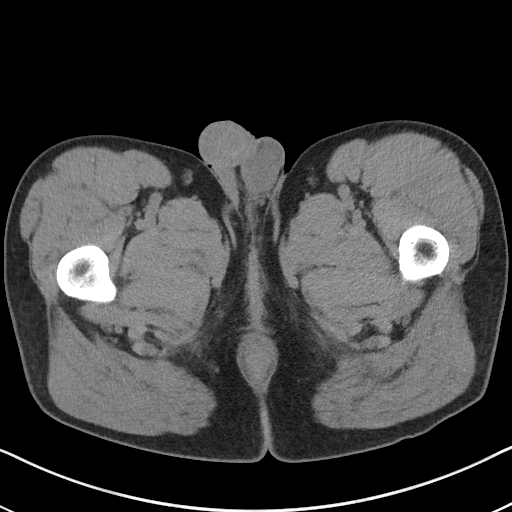
[im 4/92  bone]
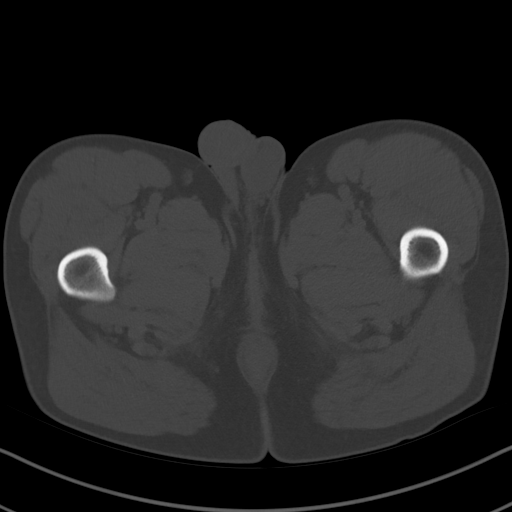
[im 12/92  soft-tissue]
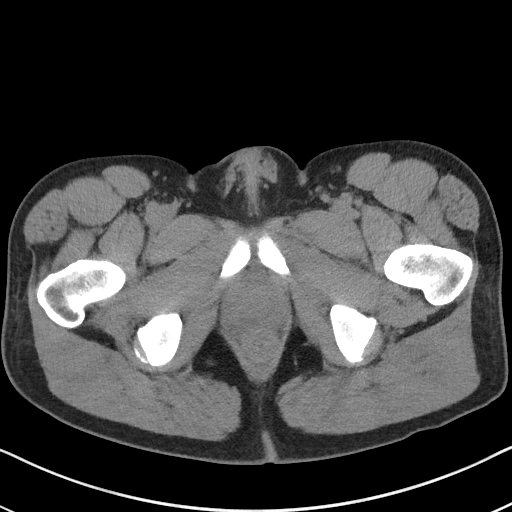
[im 19/92  soft-tissue]
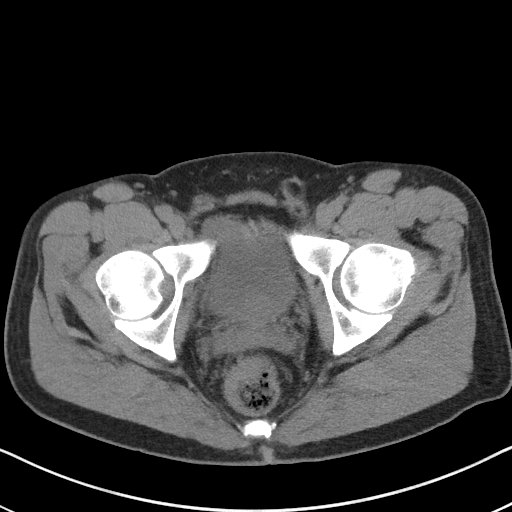
[im 27/92  soft-tissue]
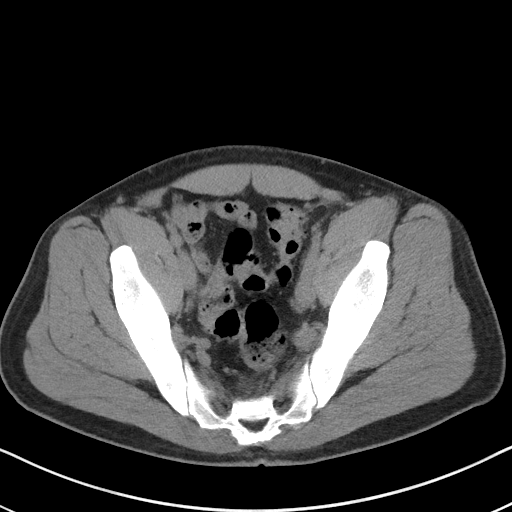
[im 35/92  soft-tissue]
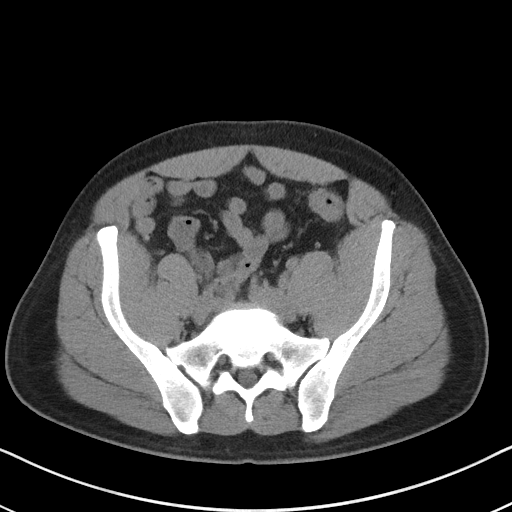
[im 42/92  soft-tissue]
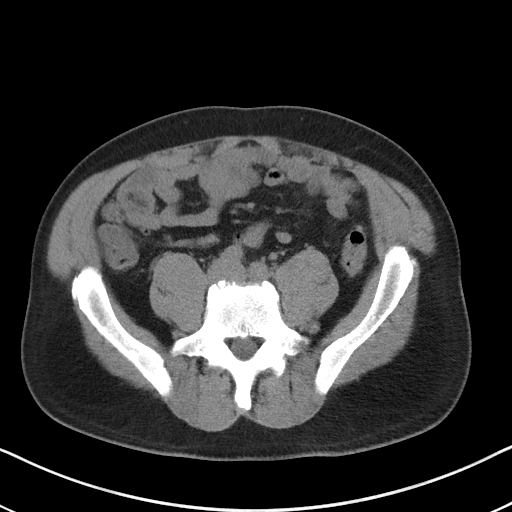
[im 50/92  soft-tissue]
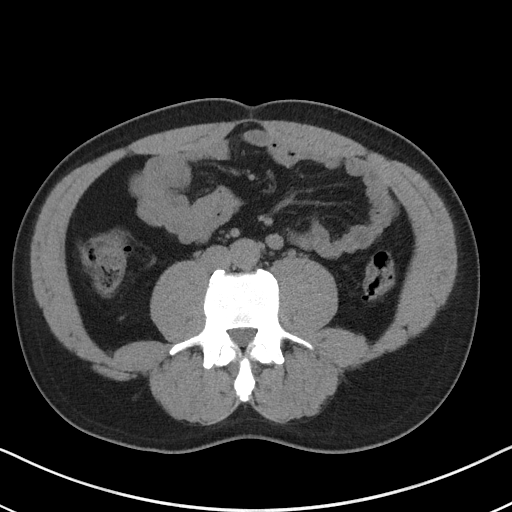
[im 57/92  soft-tissue]
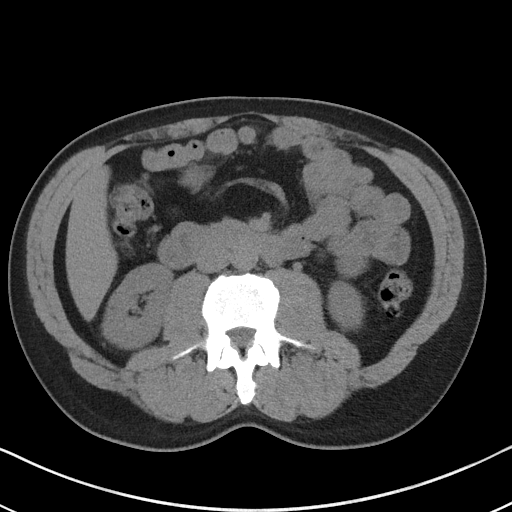
[im 65/92  soft-tissue]
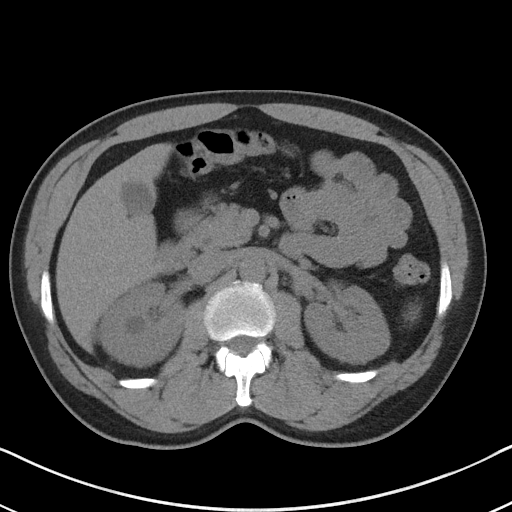
[im 65/92  bone]
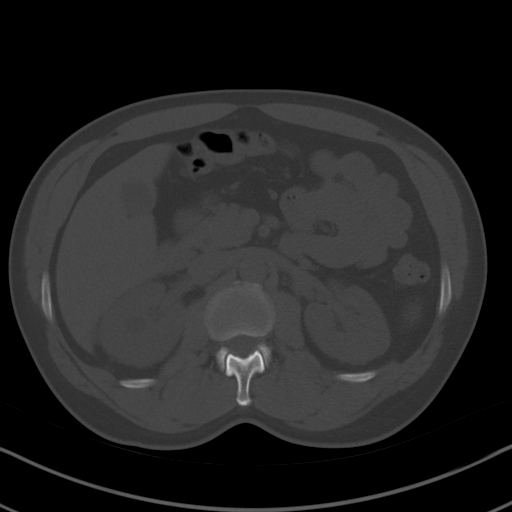
[im 73/92  soft-tissue]
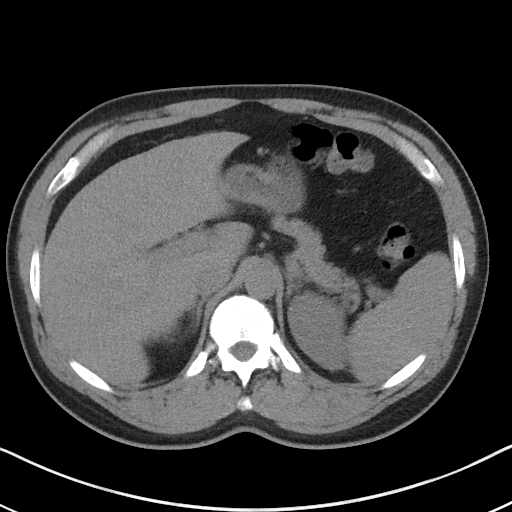
[im 80/92  soft-tissue]
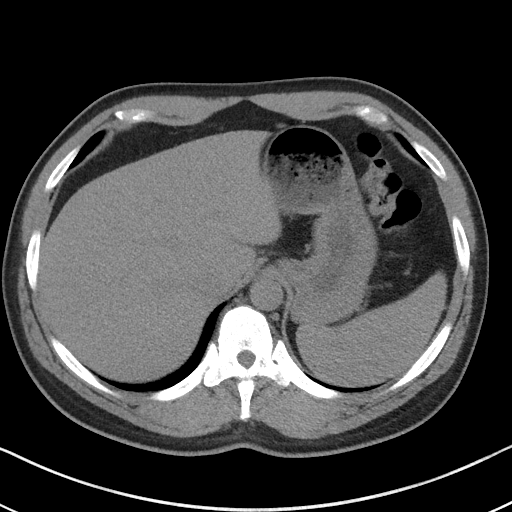
[im 88/92  soft-tissue]
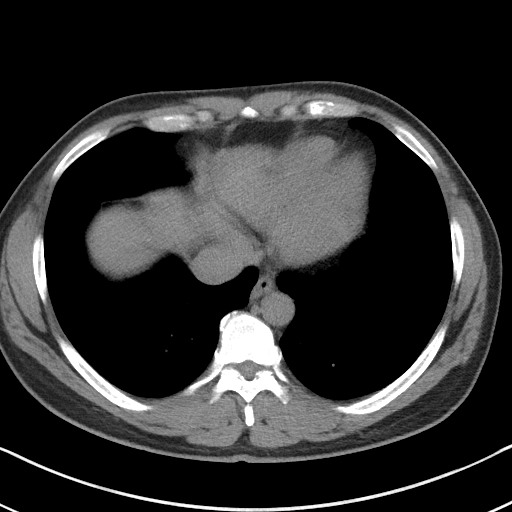

[Series 5: coronal · coronal · 0.71mm/px · 3 of 133 slices shown]
[im 45/133  soft-tissue]
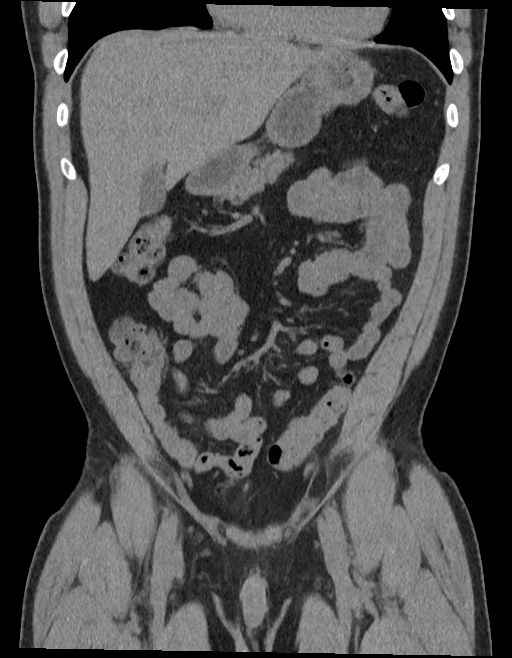
[im 59/133  soft-tissue]
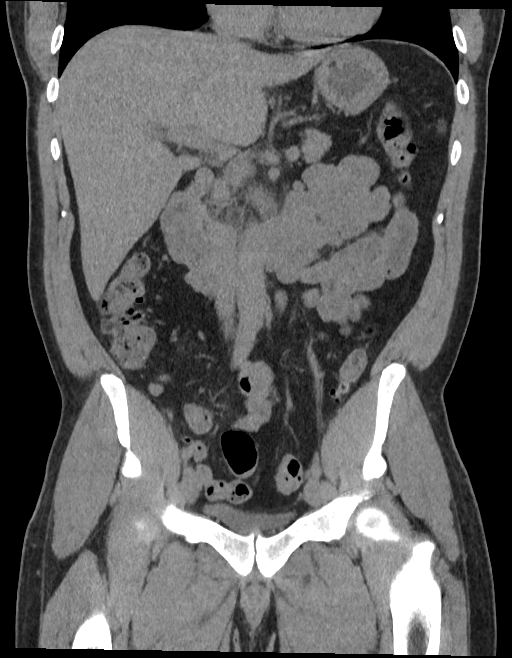
[im 74/133  soft-tissue]
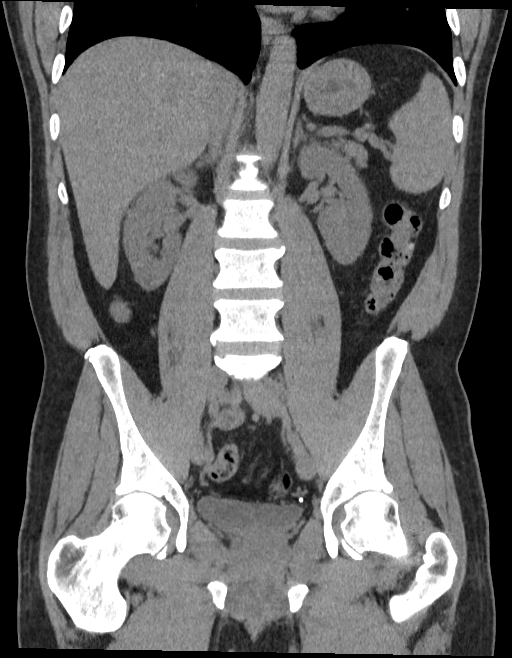

[15 of 46 positions shown; findings below may reference images not displayed]

FINDINGS: Lower chest: Unremarkable.

Hepatobiliary: No definite suspicious cystic or solid hepatic
lesions are confidently identified on today's noncontrast CT
examination. Unenhanced appearance of the gallbladder is normal.

Pancreas: No definite pancreatic mass or peripancreatic fluid
collections or inflammatory changes are noted on today's noncontrast
CT examination.

Spleen: Unremarkable.

Adrenals/Urinary Tract: There are no abnormal calcifications within
the collecting system of either kidney, along the course of either
ureter, or within the lumen of the urinary bladder. No
hydroureteronephrosis or perinephric stranding to suggest urinary
tract obstruction at this time. Multiple low-attenuation lesions are
noted in both kidneys, incompletely characterized on today's
non-contrast CT examination, but statistically likely to represent
cysts, largest of which measures 1.7 cm in diameter in the medial
aspect of the upper pole the left kidney. Unenhanced appearance of
the urinary bladder is unremarkable. Bilateral adrenal glands are
normal in appearance.

Stomach/Bowel: Unenhanced appearance of the stomach is normal. No
pathologic dilatation of small bowel or colon. Numerous colonic
diverticulae are noted, particularly in the descending colon and
sigmoid colon, without surrounding inflammatory changes to suggest
an acute diverticulitis at this time. Normal appendix.

Vascular/Lymphatic: No atherosclerotic calcifications in the
abdominal aorta or pelvic vasculature. No lymphadenopathy noted in
the abdomen or pelvis.

Reproductive: Prostate gland and seminal vesicles are unremarkable
in appearance.

Other: No significant volume of ascites.  No pneumoperitoneum.

Musculoskeletal: There are no aggressive appearing lytic or blastic
lesions noted in the visualized portions of the skeleton.
IMPRESSION: 1. No acute findings are noted in the abdomen or pelvis to account
for the patient's symptoms. Specifically, no urinary tract calculi
no findings of urinary tract obstruction are noted at this time.
2. Numerous colonic diverticulae are noted,
3. Colonic diverticulosis without evidence of acute diverticulitis
at this time.
4. Low-attenuation lesions in the kidneys bilaterally, incompletely
characterized on today's non-contrast CT examination, but previously
characterized as simple cysts.

## 2020-09-04 IMAGING — US US SCROTUM W/ DOPPLER COMPLETE
1 series · 14 of 25 positions shown · non-contrast
Comparison: 03/04/2019

CLINICAL DATA: Right testicular pain for 3 days

EXAM:
SCROTAL ULTRASOUND
DOPPLER ULTRASOUND OF THE TESTICLES
TECHNIQUE: Complete ultrasound examination of the testicles, epididymis, and
other scrotal structures was performed. Color and spectral Doppler
ultrasound were also utilized to evaluate blood flow to the
testicles.

[Series 1: us scrotum w/doppler · 14 of 69 slices shown]
[im 1/69]
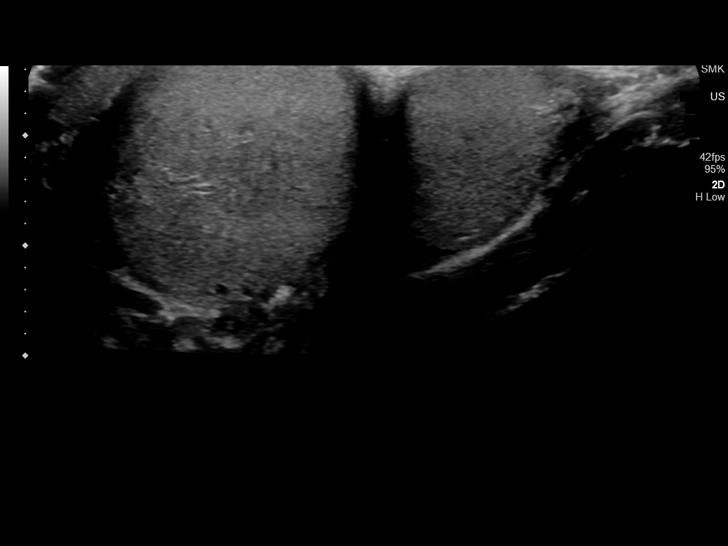
[im 6/69]
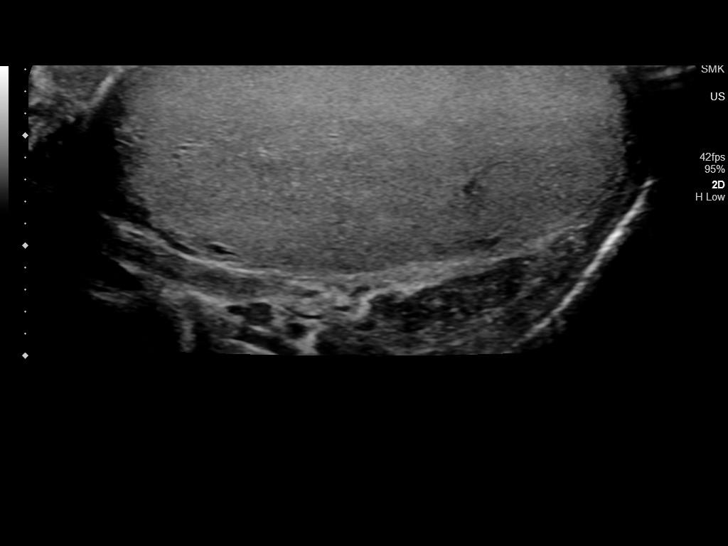
[im 12/69]
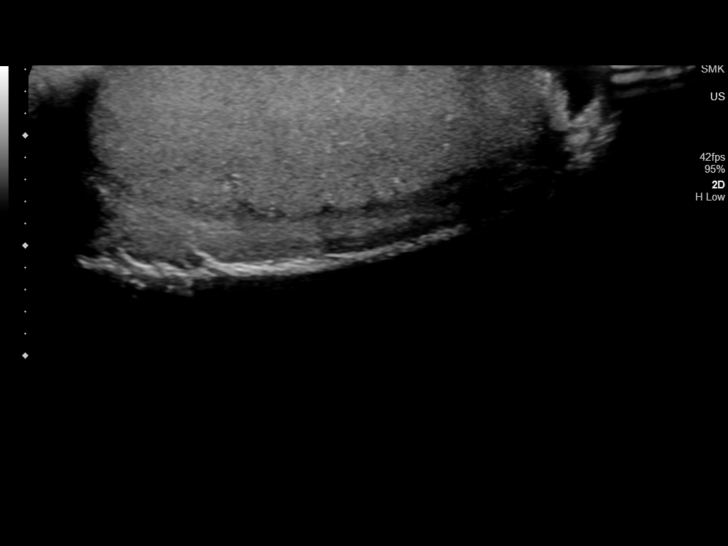
[im 18/69]
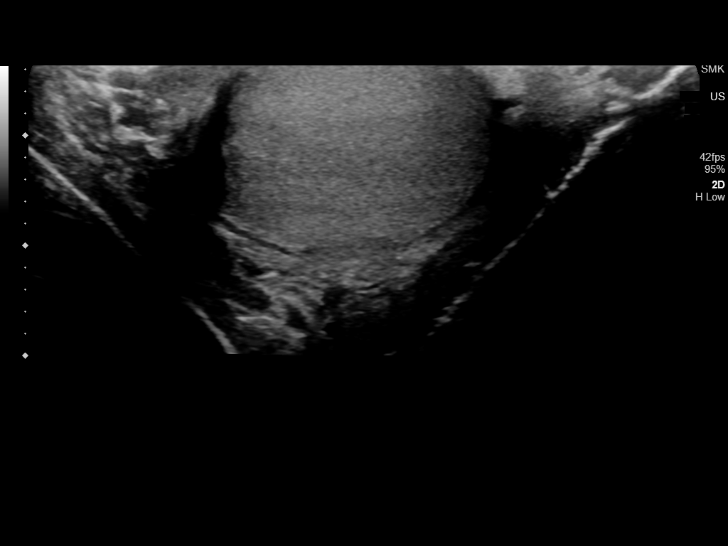
[im 23/69]
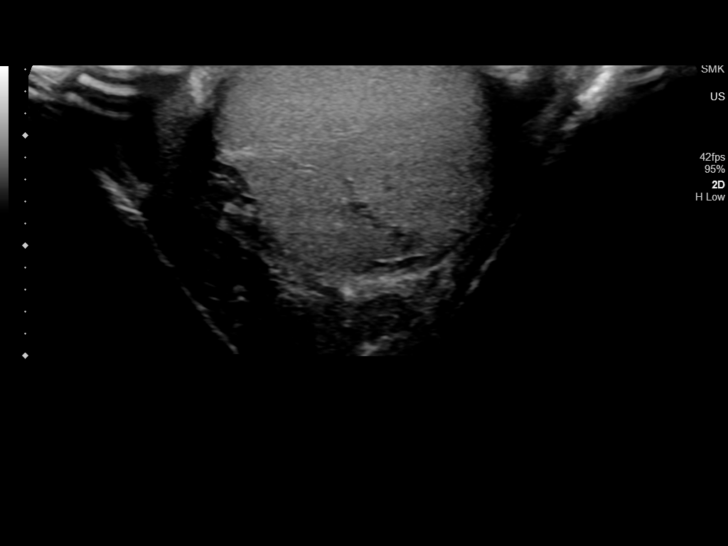
[im 26/69]
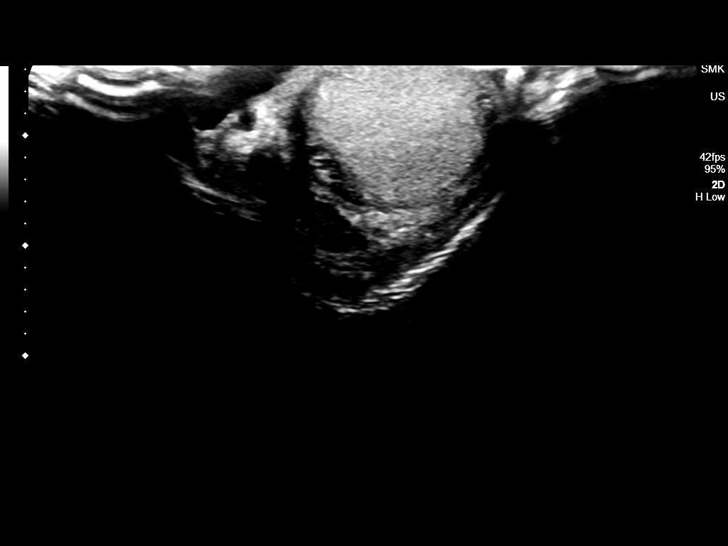
[im 32/69]
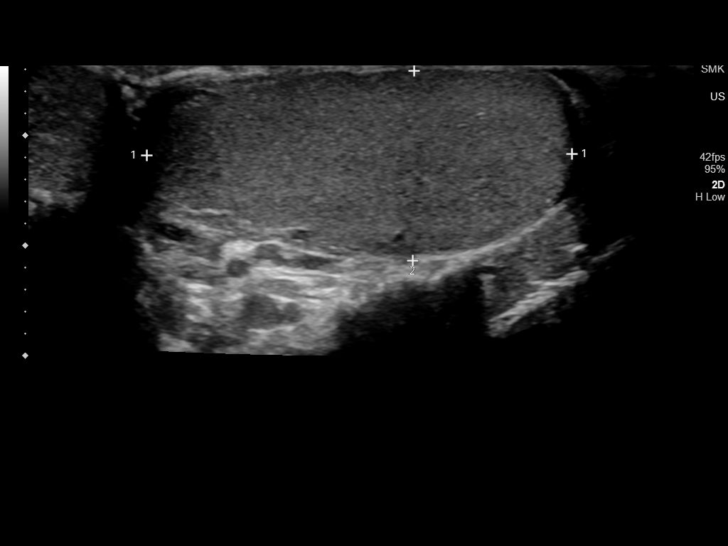
[im 37/69]
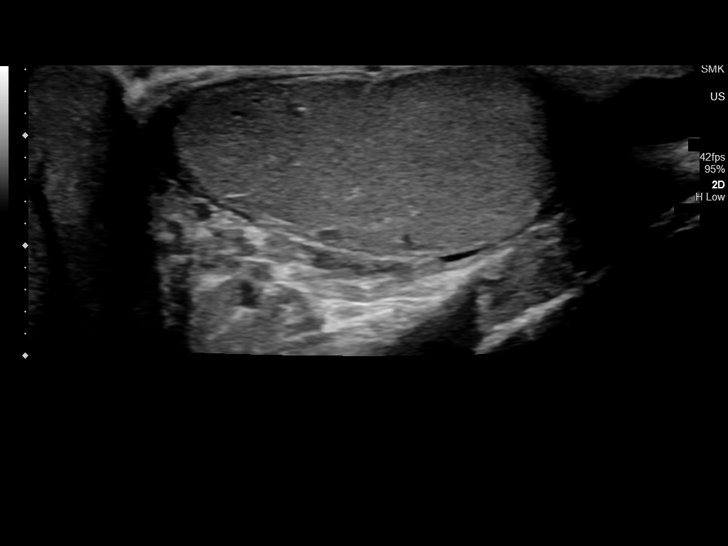
[im 43/69]
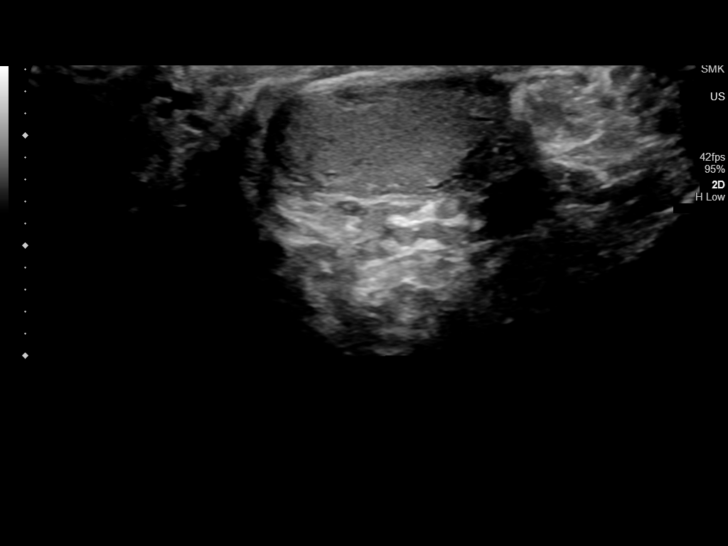
[im 46/69]
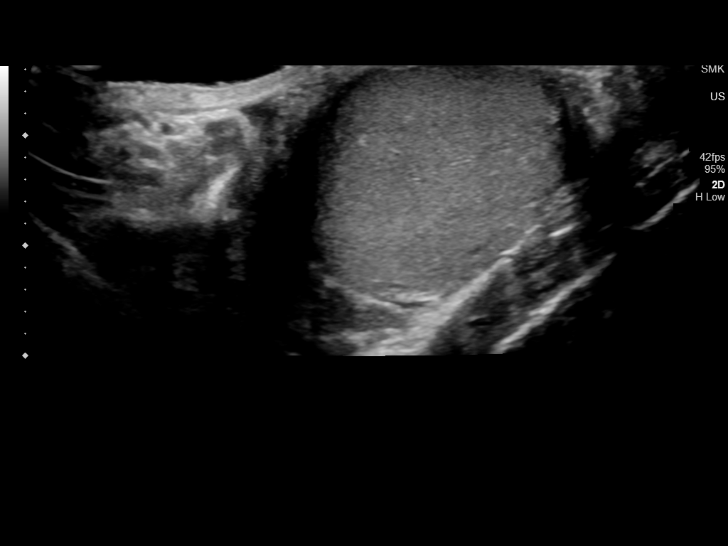
[im 52/69]
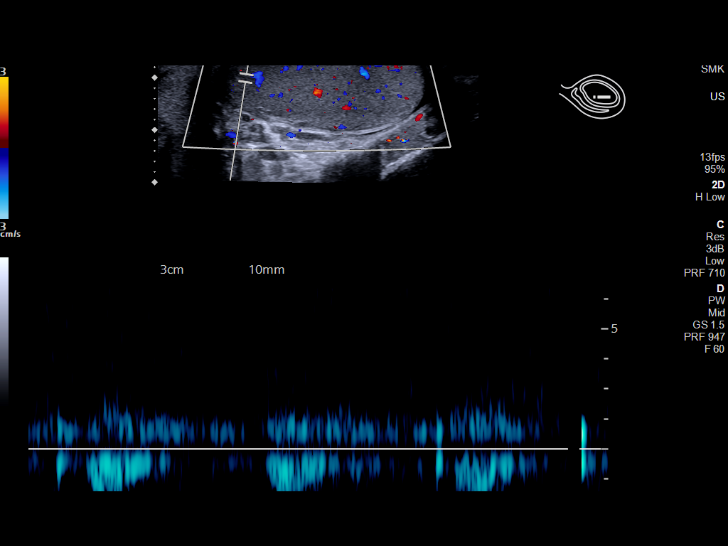
[im 57/69]
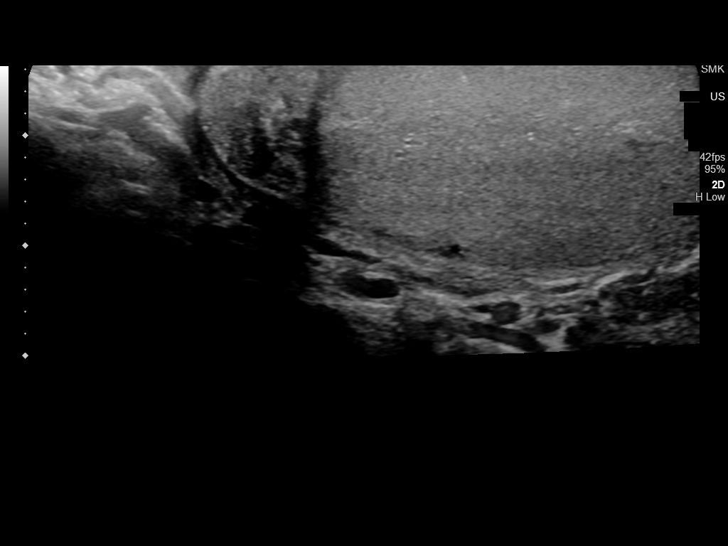
[im 63/69]
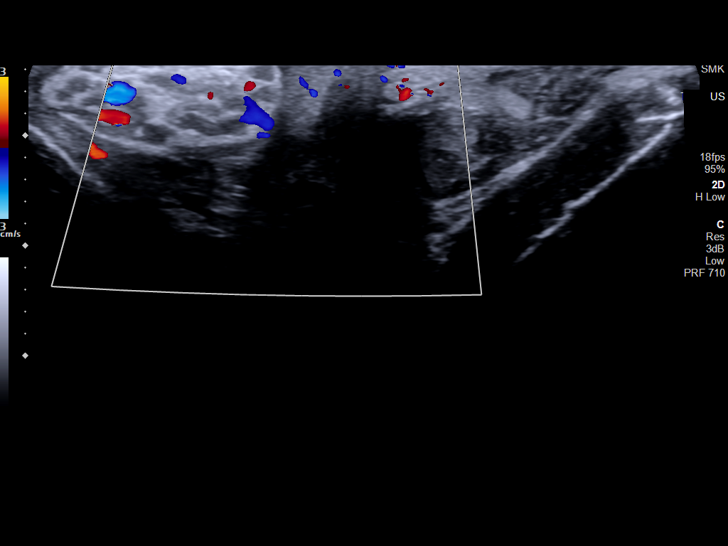
[im 69/69]
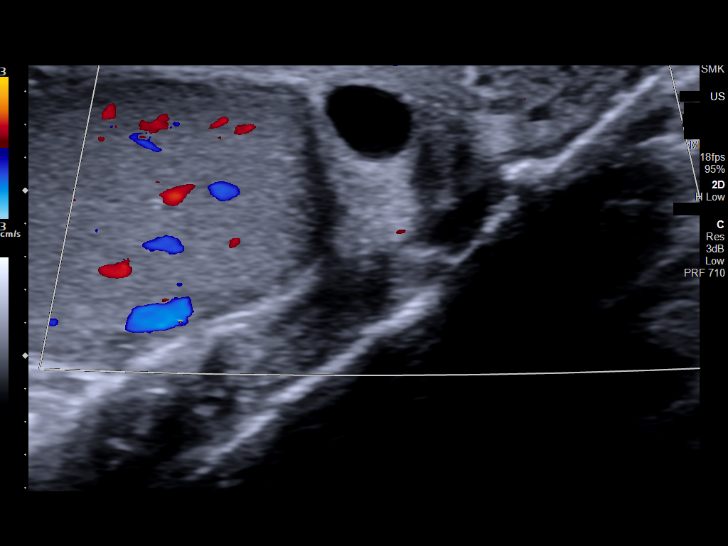

[14 of 25 positions shown; findings below may reference images not displayed]

FINDINGS: Right testicle

Measurements: 4.7 x 2.2 x 2.9 cm, volume 16 mL. No mass or
microlithiasis visualized.

Left testicle

Measurements: 3.9 x 1.7 x 2.6 cm, volume 9 mL. No mass or
microlithiasis visualized.

Right epididymis:  Normal in size and appearance.

Left epididymis: Normal in size and appearance. Simple 6 mm left
epididymal cyst.

Hydrocele:  None visualized.

Varicocele:  None visualized.

Pulsed Doppler interrogation of both testes demonstrates normal low
resistance arterial and venous waveforms bilaterally.
IMPRESSION: Negative for testicular torsion or intratesticular mass.

## 2020-09-07 ENCOUNTER — Other Ambulatory Visit: Payer: Self-pay

## 2020-09-07 ENCOUNTER — Emergency Department
Admission: EM | Admit: 2020-09-07 | Discharge: 2020-09-07 | Disposition: A | Discharge status: Home / Self Care | Payer: PRIVATE HEALTH INSURANCE | Attending: Emergency Medicine | Admitting: Emergency Medicine

## 2020-09-07 DIAGNOSIS — E119 Type 2 diabetes mellitus without complications: Secondary | ICD-10-CM | POA: Insufficient documentation

## 2020-09-07 DIAGNOSIS — Z7984 Long term (current) use of oral hypoglycemic drugs: Secondary | ICD-10-CM | POA: Diagnosis not present

## 2020-09-07 DIAGNOSIS — F17210 Nicotine dependence, cigarettes, uncomplicated: Secondary | ICD-10-CM | POA: Diagnosis not present

## 2020-09-07 DIAGNOSIS — R300 Dysuria: Secondary | ICD-10-CM | POA: Diagnosis not present

## 2020-09-07 DIAGNOSIS — R3915 Urgency of urination: Secondary | ICD-10-CM | POA: Diagnosis present

## 2020-09-07 LAB — URINALYSIS, COMPLETE (UACMP) WITH MICROSCOPIC
Bacteria, UA: NONE SEEN
Bilirubin Urine: NEGATIVE
Glucose, UA: NEGATIVE mg/dL
Ketones, ur: NEGATIVE mg/dL
Leukocytes,Ua: NEGATIVE
Nitrite: NEGATIVE
Protein, ur: NEGATIVE mg/dL
Specific Gravity, Urine: 1.02 (ref 1.005–1.030)
pH: 5 (ref 5.0–8.0)

## 2020-09-07 LAB — CHLAMYDIA/NGC RT PCR (ARMC ONLY)
Chlamydia Tr: NOT DETECTED
N gonorrhoeae: NOT DETECTED

## 2020-09-07 MED ORDER — SULFAMETHOXAZOLE-TRIMETHOPRIM 800-160 MG PO TABS: 1.0000 | ORAL_TABLET | Freq: Two times a day (BID) | ORAL | 0 refills | Status: DC

## 2020-09-07 NOTE — ED Notes (Signed)
Pt presents to the ED for recurrent UTI. Pt states that he was previously seen and he has was dx with a yeast infection, E. Coli in his urine, and a UTI. Pt states medication prescribed gave him a little relief but it came back. Pt having burning sensation when he urinates and urgency. Pt is A&Ox4 and NAD

## 2020-09-07 NOTE — ED Triage Notes (Signed)
Pt states possible UTI with burning, urgency. Takes flomax but not much urine coming out.

## 2020-09-07 NOTE — ED Notes (Signed)
Pt ambulatory to the restroom at this time.  

## 2020-09-07 NOTE — ED Provider Notes (Signed)
[facility] Emergency Department Provider Note  ____________________________________________   First MD Initiated Contact with Patient 09/07/20 [phone]     (approximate)  I have reviewed the triage vital signs and the nursing notes.   HISTORY  Chief Complaint Recurrent UTI   HPI Vincent Morton is a 54 y.o. male presents to the ED with complaint of urinary urgency and dysuria.  Patient states he has a history of recurrent UTIs and to date has not followed up with a urologist.  Patient reports that in the past he has had yeast and E. coli in his urine.  Currently he is taking Flomax that was [facility].         Past Medical History:  Diagnosis Date  . Diabetes mellitus without complication (HCC)     There are no problems to display for this patient.   History reviewed. No pertinent surgical history.  Prior to Admission medications   Medication Sig Start Date End Date Taking? Authorizing Provider  acetaminophen (TYLENOL) 500 MG tablet Take 500 mg by mouth as needed.    [provider]  metFORMIN (GLUCOPHAGE-XR) 500 MG 24 hr tablet Take 1,000 mg by mouth daily with breakfast. 11/22/17   [provider]  sulfamethoxazole-trimethoprim (BACTRIM DS) 800-160 MG tablet Take 1 tablet by mouth 2 (two) times daily. 09/07/20   Tommi Rumps, PA-C    Allergies Patient has no known allergies.  History reviewed. No pertinent family history.  Social History Social History   Tobacco Use  . Smoking status: Current Every Day Smoker    Types: Cigarettes  . Smokeless tobacco: Never Used  Substance Use Topics  . Alcohol use: Not Currently    Comment: Last drink 60yrs  . Drug use: Yes    Types: Marijuana    Review of Systems Constitutional: No fever/chills Eyes: No visual changes. ENT: No sore throat. Cardiovascular: Denies chest pain. Respiratory: Denies shortness of  breath. Gastrointestinal: No abdominal pain.  No nausea, no vomiting.  No diarrhea.  No constipation. Genitourinary: Positive for dysuria, urgency and decreased stream per patient, but no hematuria.  Negative for flank pain. Musculoskeletal: Negative for muscle skeletal pain. Skin: Negative for rash. Neurological: Negative for headaches, focal weakness or numbness.  ____________________________________________   PHYSICAL EXAM:  VITAL SIGNS: ED Triage Vitals  Enc Vitals Group     BP 09/07/20 0808 (!) 141/90     Pulse Rate 09/07/20 0808 84     Resp 09/07/20 0808 18     Temp 09/07/20 0808 98.9 F (37.2 C)     Temp Source 09/07/20 0808 Oral     SpO2 09/07/20 0808 98 %     Weight 09/07/20 0807 165 lb (74.8 kg)     Height 09/07/20 0807 5\' 8"  (1.727 m)     Head Circumference --      Peak Flow --      Pain Score 09/07/20 0807 8     Pain Loc --      Pain Edu? --      Excl. in GC? --    Constitutional: Alert and oriented. Well appearing and in no acute distress. Eyes: Conjunctivae are normal. PERRL. EOMI. Head: Atraumatic. Neck: No stridor.   Cardiovascular: Normal rate, regular rhythm. Grossly normal heart sounds.  Good peripheral circulation. Respiratory: Normal respiratory effort.  No retractions. Lungs CTAB. Gastrointestinal: Soft and nontender. No distention.  No CVA tenderness. Musculoskeletal: Moves upper and lower extremities without  difficulty.  Normal gait was noted.  Neurologic:  Normal speech and language. No gross focal neurologic deficits are appreciated.  Skin:  Skin is warm, dry and intact. No rash noted. Psychiatric: Mood and affect are normal. Speech and behavior are normal.  ____________________________________________   LABS (all labs ordered are listed, but only abnormal results are displayed)  Labs Reviewed  URINALYSIS, COMPLETE (UACMP) WITH MICROSCOPIC - Abnormal; Notable for the following components:      Result Value   Color, Urine YELLOW (*)     APPearance CLEAR (*)    Hgb urine dipstick SMALL (*)    All other components within normal limits  CHLAMYDIA/NGC RT PCR ([facility] ONLY)  URINE CULTURE    RADIOLOGY I, Tommi Rumps, personally viewed and evaluated these images (plain radiographs) as part of my medical decision making, as well as reviewing the written report by the radiologist.  Previous CT renal studies were reviewed from the past ED visits with no renal stones noted.  CT abdomen and pelvis and CT renal stone both done in 2021.  Patient has had UTIs in the past with the last one being on 04/26/2020 at which time he grew out E. coli.  PROCEDURES  Procedure(s) performed (including Critical Care):  Procedures   ____________________________________________   INITIAL IMPRESSION / ASSESSMENT AND PLAN / ED COURSE  As part of my medical decision making, I reviewed the following data within the electronic MEDICAL RECORD NUMBER Notes from prior ED visits and [place] Controlled Substance Database  54 year old male presents to the ED with urinary symptoms and a history of recurrent UTIs in the past.  Patient currently is taking Flomax that was prescribed at a clinic and he was to follow-up with a urologist but has not so far.  Past medical records were reviewed and also past imaging.  We discussed a Foley catheter for urinary retention however patient states that he cannot work or function with that and at this time would like to defer this.  He is to continue taking the Flomax as prescribed and dosage is not known.  Urinalysis today was negative and a culture was ordered.  Chlamydia and GC tests were negative.  Patient voided while in the ED.  Bladder scan showed 57 and this also was reassuring for the patient.  He is aware that should he continue to have difficulty or if he gets to the point where he is unable to urinate at all he may still need to come to the emergency department for Foley catheter. Call urologist this week for  follow-up.  ____________________________________________   FINAL CLINICAL IMPRESSION(S) / ED DIAGNOSES  Final diagnoses:  Dysuria     ED Discharge Orders         Ordered    sulfamethoxazole-trimethoprim (BACTRIM DS) 800-160 MG tablet  2 times daily        09/07/20 0954          *Please note:  Vincent Morton was evaluated in Emergency Department on 09/07/2020 for the symptoms described in the history of present illness. He was evaluated in the context of the global COVID-19 pandemic, which necessitated consideration that the patient might be at risk for infection with the SARS-CoV-2 virus that causes COVID-19. Institutional protocols and algorithms that pertain to the evaluation of patients at risk for COVID-19 are in a state of rapid change based on information released by regulatory bodies including the CDC and federal and state organizations. These policies and algorithms were followed during the  patient's care in the ED.  Some ED evaluations and interventions may be delayed as a result of limited staffing during and the pandemic.*   Note:  This document was prepared using Dragon voice recognition software and may include unintentional dictation errors.    Tommi Rumps, PA-C 09/07/20 1303    Minna Antis, MD 09/07/20 1447

## 2020-09-07 NOTE — Discharge Instructions (Addendum)
Call make an appointment with the urologist for follow-up appointment of your frequent urinary problems.  Continue taking Flomax daily as it was prescribed for you.  Increase fluids.  If anytime you are unable to urinate return to the emergency department at which time we can place a Foley catheter.

## 2020-09-08 LAB — URINE CULTURE
Culture: NO GROWTH
Special Requests: NORMAL

## 2020-09-18 ENCOUNTER — Encounter: Payer: Self-pay | Admitting: Urology

## 2020-09-18 ENCOUNTER — Ambulatory Visit (INDEPENDENT_AMBULATORY_CARE_PROVIDER_SITE_OTHER): Payer: PRIVATE HEALTH INSURANCE | Admitting: Urology

## 2020-09-18 ENCOUNTER — Other Ambulatory Visit: Payer: Self-pay

## 2020-09-18 VITALS — BP 137/87 | HR 85 | Ht 68.0 in | Wt 180.0 lb

## 2020-09-18 DIAGNOSIS — N5201 Erectile dysfunction due to arterial insufficiency: Secondary | ICD-10-CM | POA: Diagnosis not present

## 2020-09-18 DIAGNOSIS — N401 Enlarged prostate with lower urinary tract symptoms: Secondary | ICD-10-CM | POA: Insufficient documentation

## 2020-09-18 DIAGNOSIS — N390 Urinary tract infection, site not specified: Secondary | ICD-10-CM

## 2020-09-18 DIAGNOSIS — N486 Induration penis plastica: Secondary | ICD-10-CM

## 2020-09-18 LAB — BLADDER SCAN AMB NON-IMAGING: Scan Result: 24

## 2020-09-18 MED ORDER — SILDENAFIL CITRATE 20 MG PO TABS: ORAL_TABLET | ORAL | 0 refills | Status: DC

## 2020-09-18 NOTE — Progress Notes (Signed)
   09/18/2020 3:16 PM   Johney Frame 06-Feb-1966 [id number]  Referring provider: Services, [facility] [address] [place],  [place] [address]  Chief Complaint  Patient presents with  . Recurrent UTI    HPI: Vincent Morton is a 54 y.o. male who presents for evaluation of recurrent UTI.   Seen in the ED x3 June 2021 for right flank pain and urinary frequency, urgency, dysuria  Urine culture grew E. Coli  CT showed no evidence urinary calculi or hydronephrosis  Was started on Septra DS however urine culture resistant to sulfa  Subsequently switched to Levaquin resolution of symptoms however it looks like he was only treated with a 5-day course  Return visit ED 10/31 complaining of urgency and dysuria  Urinalysis was unremarkable  Was empirically treated with Septra DS  Dysuria has resolved though he has baseline urgency, urinary hesitancy  On tamsulosin  Denies gross hematuria, fever, chills   Also complains of recurrent yeast balanitis  Has difficulty achieving and maintaining erection  Complains of upward curvature with erections estimated at 15 degrees  PMH: Past Medical History:  Diagnosis Date  . Diabetes mellitus without complication [facility])     Surgical History: No past surgical history on file.  Home Medications:  Allergies as of 09/18/2020   No Known Allergies     Medication List       Accurate as of September 18, 2020  3:16 PM. If you have any questions, ask your nurse or doctor.        acetaminophen 500 MG tablet Commonly known as: TYLENOL Take 500 mg by mouth as needed.   metFORMIN 500 MG 24 hr tablet Commonly known as: GLUCOPHAGE-XR Take 1,000 mg by mouth daily with breakfast.   sulfamethoxazole-trimethoprim 800-160 MG tablet Commonly known as: BACTRIM DS Take 1 tablet by mouth 2 (two) times daily.       Allergies: No Known Allergies  Family History: No family history on file.  Social History:  reports that he has been  smoking cigarettes. He has never used smokeless tobacco. He reports previous alcohol use. He reports current drug use. Drug: Marijuana.   Physical Exam: BP 137/87   Pulse 85   Ht 5\' 8"  (1.727 m)   Wt 180 lb (81.6 kg)   BMI 27.37 kg/m   Constitutional:  Alert and oriented, No acute distress. HEENT: [place] AT, moist mucus membranes.  Trachea midline, no masses. Cardiovascular: No clubbing, cyanosis, or edema. Respiratory: Normal respiratory effort, no increased work of breathing. GI: Abdomen is soft, nontender, nondistended, no abdominal masses GU: Uncircumcised, foreskin easily retracts, glans normal in appearance, proximal shaft dorsal plaque.  Testes descended bilaterally without masses or tenderness prostate 35 g, smooth without nodules Skin: No rashes, bruises or suspicious lesions. Neurologic: Grossly intact, no focal deficits, moving all 4 extremities. Psychiatric: Normal mood and affect.  Laboratory Data:  Urinalysis Dipstick/microscopy negative   Assessment & Plan:    1.  Recurrent UTI  Probable bacterial prostatitis  2.  BPH with LUTS  No significant improvement on tamsulosin  Bladder scan PVR 24 mL  Schedule cystoscopy  3.  Erectile dysfunction  Interested in PDE 5 inhibitor trial and Rx sildenafil sent to pharmacy  4.  Peyronie's disease  Curvature not severe enough to prohibit successful intercourse and will focus on ED treatment   , MD  [facility] [address], [address] [place], [place] [phone]

## 2020-09-19 LAB — URINALYSIS, COMPLETE
Bilirubin, UA: NEGATIVE
Glucose, UA: NEGATIVE
Ketones, UA: NEGATIVE
Leukocytes,UA: NEGATIVE
Nitrite, UA: NEGATIVE
Specific Gravity, UA: >1.03 — ABNORMAL HIGH (ref 1.005–1.030)
Urobilinogen, Ur: 0.2 mg/dL (ref 0.2–1.0)
pH, UA: 5.5 (ref 5.0–7.5)

## 2020-09-19 LAB — MICROSCOPIC EXAMINATION
Bacteria, UA: NONE SEEN
Epithelial Cells (non renal): NONE SEEN /hpf (ref 0–10)

## 2020-09-21 ENCOUNTER — Encounter: Payer: Self-pay | Admitting: Urology

## 2020-10-09 ENCOUNTER — Other Ambulatory Visit: Payer: Self-pay | Admitting: Urology

## 2020-10-10 ENCOUNTER — Encounter: Payer: Self-pay | Admitting: Urology

## 2020-12-18 ENCOUNTER — Other Ambulatory Visit: Payer: Self-pay | Admitting: Urology

## 2021-10-05 ENCOUNTER — Ambulatory Visit: Payer: PRIVATE HEALTH INSURANCE | Admitting: Urology

## 2021-10-13 ENCOUNTER — Telehealth: Payer: Self-pay

## 2021-10-13 NOTE — Telephone Encounter (Signed)
Left vm to confirm 10/14/21 appointment-Toni

## 2021-10-14 ENCOUNTER — Ambulatory Visit: Payer: 59 | Admitting: Nurse Practitioner

## 2021-10-28 ENCOUNTER — Ambulatory Visit: Payer: PRIVATE HEALTH INSURANCE | Admitting: Urology

## 2021-11-04 ENCOUNTER — Ambulatory Visit (INDEPENDENT_AMBULATORY_CARE_PROVIDER_SITE_OTHER): Payer: 59 | Admitting: Urology

## 2021-11-04 ENCOUNTER — Other Ambulatory Visit: Payer: Self-pay

## 2021-11-04 ENCOUNTER — Encounter: Payer: Self-pay | Admitting: Urology

## 2021-11-04 VITALS — BP 124/80 | HR 102 | Ht 68.0 in | Wt 180.0 lb

## 2021-11-04 DIAGNOSIS — N401 Enlarged prostate with lower urinary tract symptoms: Secondary | ICD-10-CM

## 2021-11-04 DIAGNOSIS — N390 Urinary tract infection, site not specified: Secondary | ICD-10-CM | POA: Diagnosis not present

## 2021-11-04 DIAGNOSIS — R972 Elevated prostate specific antigen [PSA]: Secondary | ICD-10-CM | POA: Diagnosis not present

## 2021-11-04 LAB — BLADDER SCAN AMB NON-IMAGING: Scan Result: 22

## 2021-11-04 LAB — URINALYSIS, COMPLETE
Bilirubin, UA: NEGATIVE
Glucose, UA: NEGATIVE
Ketones, UA: NEGATIVE
Nitrite, UA: NEGATIVE
Specific Gravity, UA: 1.025 (ref 1.005–1.030)
Urobilinogen, Ur: 0.2 mg/dL (ref 0.2–1.0)
pH, UA: 5.5 (ref 5.0–7.5)

## 2021-11-04 LAB — MICROSCOPIC EXAMINATION
Bacteria, UA: NONE SEEN
Epithelial Cells (non renal): NONE SEEN /hpf (ref 0–10)
WBC, UA: >30 /hpf — AB (ref 0–5)

## 2021-11-04 NOTE — Progress Notes (Signed)
° °  11/04/2021 9:08 PM   Vincent Morton 1966-10-06 [id number]  Referring provider: Services, [facility] [address] [place],  [place] [address]  Chief Complaint  Patient presents with   Benign Prostatic Hypertrophy    HPI: 55 y.o. male presents for follow-up.  Initially seen 09/18/2020.  Cystoscopy was recommended and scheduled 10/09/2020 however he no showed. PCP records sent over from October and June 2022 were reviewed and PSA 09/07/2021 was 5.9.  Dipstick UA was unremarkable Remains on tamsulosin and feels that voiding symptoms have improved Also using sildenafil for ED Is having mild dysuria and pelvic discomfort  PMH: Past Medical History:  Diagnosis Date   Diabetes mellitus without complication (HCC)     Surgical History: History reviewed. No pertinent surgical history.  Home Medications:  Allergies as of 11/04/2021   No Known Allergies      Medication List        Accurate as of November 04, 2021 11:59 PM. If you have any questions, ask your nurse or doctor.          metFORMIN 500 MG tablet Commonly known as: GLUCOPHAGE Take 1,000 mg by mouth daily with breakfast. What changed: Another medication with the same name was removed. Continue taking this medication, and follow the directions you see here. Changed by: Riki Altes, MD   ProAir HFA 108 [phone] Base) MCG/ACT inhaler Generic drug: albuterol Inhale 2 puffs into the lungs every 4 (four) hours as needed.   sildenafil 20 MG tablet Commonly known as: REVATIO TAKE 2 TO 5 TABLETS BY MOUTH 1 HOUR PRIOR TO INTERCOURSE.   tamsulosin 0.4 MG Caps capsule Commonly known as: FLOMAX Take 0.8 mg by mouth daily.        Allergies: No Known Allergies  Family History: History reviewed. No pertinent family history.  Social History:  reports that he has been smoking cigarettes. He has never used smokeless tobacco. He reports that he does not currently use alcohol. He reports current drug use. Drug:  Marijuana.   Physical Exam: BP 124/80    Pulse (!) 102    Ht 5\' 8"  (1.727 m)    Wt 180 lb (81.6 kg)    BMI 27.37 kg/m   Constitutional:  Alert and oriented, No acute distress. HEENT: [place] AT, moist mucus membranes.  Trachea midline, no masses. Cardiovascular: No clubbing, cyanosis, or edema. Respiratory: Normal respiratory effort, no increased work of breathing. GU: Prostate 50 g, smooth without nodules Skin: No rashes, bruises or suspicious lesions. Neurologic: Grossly intact, no focal deficits, moving all 4 extremities. Psychiatric: Normal mood and affect.  Laboratory Data:  Urinalysis Appearance yellow, hazy Dipstick 2+ leukocytes/1+ RBC Microscopy >30 WBC   Assessment & Plan:    1. Benign prostatic hyperplasia with lower urinary tract symptoms, symptom details unspecified Some symptom improvement on tamsulosin Bladder scan PVR 22 mL  2.  Recurrent UTI Pyuria today with mild symptoms Urine culture ordered  3.  Elevated PSA Benign DRE Will await urine culture results and if positive treat with antibiotics then repeat PSA If PSA remains elevated would recommend scheduling TRUS/biopsy of the prostate for evaluation of his elevated PSA and cystoscopy for evaluation of his recurrent UTI    , MD  [facility] [address] [place], [place] [id number]

## 2021-11-05 ENCOUNTER — Encounter: Payer: Self-pay | Admitting: Urology

## 2021-11-08 LAB — CULTURE, URINE COMPREHENSIVE

## 2021-11-10 ENCOUNTER — Telehealth: Payer: Self-pay | Admitting: *Deleted

## 2021-11-10 ENCOUNTER — Other Ambulatory Visit: Payer: Self-pay | Admitting: *Deleted

## 2021-11-10 MED ORDER — SULFAMETHOXAZOLE-TRIMETHOPRIM 800-160 MG PO TABS: 1.0000 | ORAL_TABLET | Freq: Two times a day (BID) | ORAL | 0 refills | Status: AC

## 2021-11-10 NOTE — Telephone Encounter (Signed)
-----   Message from Abbie Sons, MD sent at 11/10/2021  7:32 AM EST ----- Urine culture was positive for infection.  Please send Rx Septra DS 1 twice daily x14 days.  Follow-up lab visit for repeat UA/PSA which I think is scheduled in late June

## 2021-11-10 NOTE — Telephone Encounter (Signed)
Notified patient wife per DPR as instructed. Discussed follow-up appointments

## 2021-11-27 ENCOUNTER — Other Ambulatory Visit: Payer: Self-pay

## 2021-11-27 ENCOUNTER — Telehealth: Payer: Self-pay

## 2021-11-27 NOTE — Telephone Encounter (Signed)
Patient having gi issues scheduled for a in person visit

## 2021-11-27 NOTE — Telephone Encounter (Signed)
CALLED PATIENT NO ANSWER LEFT VOICEMAIL FOR A CALL BACK To schedule a colonoscopy

## 2021-11-27 NOTE — Progress Notes (Unsigned)
Gastroenterology Pre-Procedure Review  Request Date: *** Requesting Physician: Dr. Marland Kitchen  PATIENT REVIEW QUESTIONS: The patient responded to the following health history questions as indicated:    1. Are you having any GI issues? no 2. Do you have a personal history of Polyps? {Yes/No:19989} 3. Do you have a family history of Colon Cancer or Polyps? {Yes/No:19989} 4. Diabetes Mellitus? {Yes/No:19989} 5. Joint replacements in the past 12 months?{Yes/No:19989} 6. Major health problems in the past 3 months?{Yes/No:19989} 7. Any artificial heart valves, MVP, or defibrillator?{Yes/No:19989}    MEDICATIONS & ALLERGIES:    Patient reports the following regarding taking any anticoagulation/antiplatelet therapy:   Plavix, Coumadin, Eliquis, Xarelto, Lovenox, Pradaxa, Brilinta, or Effient? {Yes/No:19989} Aspirin? {Yes/No:19989}  Patient confirms/reports the following medications:  Current Outpatient Medications  Medication Sig Dispense Refill   metFORMIN (GLUCOPHAGE) 500 MG tablet Take 1,000 mg by mouth daily with breakfast.     PROAIR HFA 108 (90 Base) MCG/ACT inhaler Inhale 2 puffs into the lungs every 4 (four) hours as needed.     sildenafil (REVATIO) 20 MG tablet TAKE 2 TO 5 TABLETS BY MOUTH 1 HOUR PRIOR TO INTERCOURSE. 30 tablet 0   tamsulosin (FLOMAX) 0.4 MG CAPS capsule Take 0.8 mg by mouth daily.     No current facility-administered medications for this visit.    Patient confirms/reports the following allergies:  No Known Allergies  No orders of the defined types were placed in this encounter.   AUTHORIZATION INFORMATION Primary Insurance: 1D#: Group #:  Secondary Insurance: 1D#: Group #:  SCHEDULE INFORMATION: Date:  Time: Location:

## 2021-12-07 ENCOUNTER — Other Ambulatory Visit: Payer: 59

## 2021-12-07 ENCOUNTER — Other Ambulatory Visit: Payer: Self-pay | Admitting: *Deleted

## 2021-12-07 DIAGNOSIS — N401 Enlarged prostate with lower urinary tract symptoms: Secondary | ICD-10-CM

## 2021-12-07 DIAGNOSIS — N390 Urinary tract infection, site not specified: Secondary | ICD-10-CM

## 2021-12-21 ENCOUNTER — Other Ambulatory Visit: Payer: 59

## 2022-01-04 ENCOUNTER — Other Ambulatory Visit: Payer: 59

## 2022-02-03 ENCOUNTER — Ambulatory Visit: Payer: PRIVATE HEALTH INSURANCE | Admitting: Gastroenterology

## 2022-04-28 ENCOUNTER — Other Ambulatory Visit: Payer: Self-pay

## 2022-04-29 ENCOUNTER — Ambulatory Visit: Payer: PRIVATE HEALTH INSURANCE | Admitting: Gastroenterology

## 2022-08-19 DIAGNOSIS — E119 Type 2 diabetes mellitus without complications: Secondary | ICD-10-CM | POA: Diagnosis not present

## 2022-08-19 DIAGNOSIS — R972 Elevated prostate specific antigen [PSA]: Secondary | ICD-10-CM | POA: Diagnosis not present

## 2022-08-19 DIAGNOSIS — Z712 Person consulting for explanation of examination or test findings: Secondary | ICD-10-CM | POA: Diagnosis not present

## 2022-08-19 DIAGNOSIS — Z1389 Encounter for screening for other disorder: Secondary | ICD-10-CM | POA: Diagnosis not present

## 2022-08-19 DIAGNOSIS — Z125 Encounter for screening for malignant neoplasm of prostate: Secondary | ICD-10-CM | POA: Diagnosis not present

## 2022-08-19 DIAGNOSIS — F17200 Nicotine dependence, unspecified, uncomplicated: Secondary | ICD-10-CM | POA: Diagnosis not present

## 2022-08-19 DIAGNOSIS — R7309 Other abnormal glucose: Secondary | ICD-10-CM | POA: Diagnosis not present

## 2022-08-19 DIAGNOSIS — R911 Solitary pulmonary nodule: Secondary | ICD-10-CM | POA: Diagnosis not present

## 2022-08-19 DIAGNOSIS — Z1159 Encounter for screening for other viral diseases: Secondary | ICD-10-CM | POA: Diagnosis not present

## 2022-09-14 ENCOUNTER — Ambulatory Visit (INDEPENDENT_AMBULATORY_CARE_PROVIDER_SITE_OTHER): Payer: BC Managed Care – PPO | Admitting: Gastroenterology

## 2022-09-14 ENCOUNTER — Encounter: Payer: Self-pay | Admitting: Gastroenterology

## 2022-09-14 ENCOUNTER — Other Ambulatory Visit: Payer: Self-pay

## 2022-09-14 VITALS — BP 126/84 | HR 80 | Temp 98.6°F | Ht 68.0 in | Wt 181.1 lb

## 2022-09-14 DIAGNOSIS — R1013 Epigastric pain: Secondary | ICD-10-CM

## 2022-09-14 DIAGNOSIS — Z8719 Personal history of other diseases of the digestive system: Secondary | ICD-10-CM

## 2022-09-14 DIAGNOSIS — R140 Abdominal distension (gaseous): Secondary | ICD-10-CM

## 2022-09-14 MED ORDER — NA SULFATE-K SULFATE-MG SULF 17.5-3.13-1.6 GM/177ML PO SOLN: 354.0000 mL | Freq: Once | ORAL | 0 refills | Status: AC

## 2022-09-14 NOTE — Progress Notes (Signed)
Cephas Darby, MD [address]  [place]  [place], [place] [address]  Main: [phone]  Fax: [phone]    Gastroenterology Consultation  Referring Provider:     Theotis Burrow* Primary Care Physician:  Services, [facility] Primary Gastroenterologist:  Dr. Cephas Darby Reason for Consultation: Dyspepsia, abdominal bloating        HPI:   Vincent Morton is a 56 y.o. male referred by Services, [facility]  for consultation & management of dyspepsia, abdominal bloating.  Patient reports chronic symptoms of generalized abdominal bloating, upper abdominal discomfort.  He has history of diabetes on metformin, last hemoglobin A1c 7.5.  Patient is accompanied by his wife today.  He has history of chronic tobacco use, drinks carbonated beverages, diet Coke daily.  He has history of distal descending colon diverticulitis in 12/2019 which was uncomplicated.  Patient reports having regular bowel movements, does report rectal pressure whenever he has a bowel movement.  He denies any rectal bleeding.  No evidence of anemia based on CBC from 04/2020.  Patient reports upper abdominal discomfort with early satiety, intermittent nausea.   NSAIDs: None  Antiplts/Anticoagulants/Anti thrombotics: None  GI Procedures: None  Past Medical History:  Diagnosis Date   Diabetes mellitus without complication ([place])     History reviewed. No pertinent surgical history.   Current Outpatient Medications:    ASPIRIN LOW DOSE 81 MG tablet, Take 81 mg by mouth daily., Disp: , Rfl:    atorvastatin (LIPITOR) 40 MG tablet, , Disp: , Rfl:    metFORMIN (GLUCOPHAGE-XR) 500 MG 24 hr tablet, Take 1,000 mg by mouth 2 (two) times daily., Disp: , Rfl:    Na Sulfate-K Sulfate-Mg Sulf 17.5-3.13-1.6 GM/177ML SOLN, Take 354 mLs by mouth once for 1 dose., Disp: 354 mL, Rfl: 0   PROAIR HFA 108 (90 Base) MCG/ACT inhaler, Inhale 2 puffs into the lungs every 4 (four) hours as needed., Disp: , Rfl:     sildenafil (REVATIO) 20 MG tablet, TAKE 2 TO 5 TABLETS BY MOUTH 1 HOUR PRIOR TO INTERCOURSE., Disp: 30 tablet, Rfl: 0   tamsulosin (FLOMAX) 0.4 MG CAPS capsule, Take 0.8 mg by mouth daily., Disp: , Rfl:    No family history on file.   Social History   Tobacco Use   Smoking status: Every Day    Types: Cigarettes   Smokeless tobacco: Never  Substance Use Topics   Alcohol use: Not Currently    Comment: Last drink 33yrs   Drug use: Yes    Types: Marijuana    Allergies as of 09/14/2022   (No Known Allergies)    Review of Systems:    All systems reviewed and negative except where noted in HPI.   Physical Exam:  BP 126/84 (BP Location: Left Arm, Patient Position: Sitting, Cuff Size: Normal)   Pulse 80   Temp 98.6 F (37 C) (Oral)   Ht 5\' 8"  (1.727 m)   Wt 181 lb 2 oz (82.2 kg)   BMI 27.54 kg/m  No LMP for male patient.  General:   Alert,  Well-developed, well-nourished, pleasant and cooperative in NAD Head:  Normocephalic and atraumatic. Eyes:  Sclera clear, no icterus.   Conjunctiva pink. Ears:  Normal auditory acuity. Nose:  No deformity, discharge, or lesions. Mouth:  No deformity or lesions,oropharynx pink & moist. Neck:  Supple; no masses or thyromegaly. Lungs:  Respirations even and unlabored.  Clear throughout to auscultation.   No wheezes, crackles, or rhonchi. No acute distress.  Heart:  Regular rate and rhythm; no murmurs, clicks, rubs, or gallops. Abdomen:  Normal bowel sounds. Soft, non-tender and diffusely distended, tympanic to percussion without masses, hepatosplenomegaly or hernias noted.  No guarding or rebound tenderness.   Rectal: Not performed Msk:  Symmetrical without gross deformities. Good, equal movement & strength bilaterally. Pulses:  Normal pulses noted. Extremities:  No clubbing or edema.  No cyanosis. Neurologic:  Alert and oriented x3;  grossly normal neurologically. Skin:  Intact without significant lesions or rashes. No jaundice. Psych:   Alert and cooperative. Normal mood and affect.  Imaging Studies: Reviewed  Assessment and Plan:   Vincent Morton is a 56 y.o. African-American male with history of diabetes, history of descending colon diverticulitis is seen in consultation for symptoms of dyspepsia and generalized abdominal bloating, rectal pressure  Recommend upper endoscopy and colonoscopy for further evaluation Discussed with patient regarding complete elimination of processed foods, sugary drinks, tight control of diabetes, reduce intake of red meat to prevent recurrent episodes of diverticulitis and abdominal bloating Check CBC and CMP today  I have discussed alternative options, risks & benefits,  which include, but are not limited to, bleeding, infection, perforation,respiratory complication & drug reaction.  The patient agrees with this plan & written consent will be obtained.      Follow up in 6 months   Arlyss Repress, MD

## 2022-09-15 ENCOUNTER — Telehealth: Payer: Self-pay

## 2022-09-15 LAB — COMPREHENSIVE METABOLIC PANEL
ALT: 30 IU/L (ref 0–44)
AST: 17 IU/L (ref 0–40)
Albumin/Globulin Ratio: 1.9 (ref 1.2–2.2)
Albumin: 4.8 g/dL (ref 3.8–4.9)
Alkaline Phosphatase: 81 IU/L (ref 44–121)
BUN/Creatinine Ratio: 11 (ref 9–20)
BUN: 13 mg/dL (ref 6–24)
Bilirubin Total: 0.5 mg/dL (ref 0.0–1.2)
CO2: 18 mmol/L — ABNORMAL LOW (ref 20–29)
Calcium: 10.1 mg/dL (ref 8.7–10.2)
Chloride: 105 mmol/L (ref 96–106)
Creatinine, Ser: 1.15 mg/dL (ref 0.76–1.27)
Globulin, Total: 2.5 g/dL (ref 1.5–4.5)
Glucose: 116 mg/dL — ABNORMAL HIGH (ref 70–99)
Potassium: 4.4 mmol/L (ref 3.5–5.2)
Sodium: 140 mmol/L (ref 134–144)
Total Protein: 7.3 g/dL (ref 6.0–8.5)
eGFR: 75 mL/min/{1.73_m2} (ref >59)

## 2022-09-15 LAB — CBC
Hematocrit: 44.1 % (ref 37.5–51.0)
Hemoglobin: 15.4 g/dL (ref 13.0–17.7)
MCH: 29.6 pg (ref 26.6–33.0)
MCHC: 34.9 g/dL (ref 31.5–35.7)
MCV: 85 fL (ref 79–97)
Platelets: 206 10*3/uL (ref 150–450)
RBC: 5.2 x10E6/uL (ref 4.14–5.80)
RDW: 13.3 % (ref 11.6–15.4)
WBC: 8.6 10*3/uL (ref 3.4–10.8)

## 2022-09-15 NOTE — Telephone Encounter (Signed)
Patient verbalized understanding of results  

## 2022-09-15 NOTE — Telephone Encounter (Signed)
-----   Message from Vincent Reil, MD sent at 09/15/2022 11:57 AM EST ----- Please inform patient that the labs from yesterday came back normal.  No evidence of anemia and his liver and kidney function are also normal.  RV

## 2022-10-09 ENCOUNTER — Other Ambulatory Visit: Payer: Self-pay | Admitting: Urology

## 2022-10-11 ENCOUNTER — Other Ambulatory Visit: Payer: Self-pay | Admitting: Urology

## 2022-10-13 DIAGNOSIS — E119 Type 2 diabetes mellitus without complications: Secondary | ICD-10-CM | POA: Diagnosis not present

## 2022-10-13 DIAGNOSIS — H52223 Regular astigmatism, bilateral: Secondary | ICD-10-CM | POA: Diagnosis not present

## 2022-11-03 ENCOUNTER — Telehealth: Payer: Self-pay | Admitting: Gastroenterology

## 2022-11-03 NOTE — Telephone Encounter (Signed)
Patients wife called stating that the patient started a new job and would like to reschedule his colonoscopy for after he gets his new insurance. Requesting call back to reschedule.

## 2022-11-03 NOTE — Telephone Encounter (Signed)
Pt returned call and has been rescheduled to 12/31/22.

## 2022-11-03 NOTE — Telephone Encounter (Addendum)
LVM for pt to return my call. Pt's wife number is incorrect in the system.

## 2022-11-09 ENCOUNTER — Telehealth: Payer: Self-pay

## 2022-11-09 NOTE — Telephone Encounter (Signed)
Dr. Marius Ditch is not going to be in the office on 12/31/2022.  for his procedure and we need to reschedule his procedure. Called patient and he states he would like a Friday reschedule to 01/07/2023. Called ENDO and reschedule with Arbie Cookey. Sent new instructions to patient and mailed them

## 2022-11-19 ENCOUNTER — Ambulatory Visit: Payer: BC Managed Care – PPO | Admitting: Nurse Practitioner

## 2022-12-24 ENCOUNTER — Ambulatory Visit: Payer: BC Managed Care – PPO | Admitting: Nurse Practitioner

## 2022-12-24 ENCOUNTER — Telehealth: Payer: Self-pay | Admitting: Gastroenterology

## 2022-12-24 NOTE — Telephone Encounter (Signed)
Pt called to cancel procedure on 01/07/2023 Because of insurance issues would like a call back to reschedule  [phone]

## 2022-12-27 NOTE — Telephone Encounter (Signed)
Called and patient states he is getting new insurance that starts 01/07/2023. Rescheduled procedure to 01/31/2023. Called endo and talk to [place] and she moved patient. Informed patient we needed her new insurance information.

## 2023-01-20 ENCOUNTER — Ambulatory Visit: Payer: Medicaid Other | Admitting: Nurse Practitioner

## 2023-01-28 ENCOUNTER — Encounter: Payer: Self-pay | Admitting: Gastroenterology

## 2023-01-28 ENCOUNTER — Telehealth: Payer: Self-pay

## 2023-01-28 MED ORDER — NA SULFATE-K SULFATE-MG SULF 17.5-3.13-1.6 GM/177ML PO SOLN: 1.0000 | Freq: Once | ORAL | 0 refills | Status: AC

## 2023-01-28 NOTE — Telephone Encounter (Signed)
Received message from [place] in Endo that patient was unaware of colonoscopy scheduled for Monday and knew nothing about going to the pharmacy for the prep.  Prep has been sent to [facility] on [address].  Thanks, [place], [place]

## 2023-01-31 ENCOUNTER — Encounter
Admission: RE | Disposition: A | Discharge status: Home / Self Care | Payer: Self-pay | Source: Home / Self Care | Attending: Gastroenterology

## 2023-01-31 ENCOUNTER — Ambulatory Visit: Payer: BC Managed Care – PPO | Admitting: Certified Registered"

## 2023-01-31 ENCOUNTER — Telehealth: Payer: Self-pay

## 2023-01-31 ENCOUNTER — Ambulatory Visit
Admission: RE | Admit: 2023-01-31 | Discharge: 2023-01-31 | Disposition: A | Discharge status: Home / Self Care | Payer: BC Managed Care – PPO | Attending: Gastroenterology | Admitting: Gastroenterology

## 2023-01-31 DIAGNOSIS — K6289 Other specified diseases of anus and rectum: Secondary | ICD-10-CM | POA: Diagnosis not present

## 2023-01-31 DIAGNOSIS — R140 Abdominal distension (gaseous): Secondary | ICD-10-CM

## 2023-01-31 DIAGNOSIS — R1013 Epigastric pain: Secondary | ICD-10-CM

## 2023-01-31 DIAGNOSIS — Z8719 Personal history of other diseases of the digestive system: Secondary | ICD-10-CM | POA: Diagnosis not present

## 2023-01-31 DIAGNOSIS — K30 Functional dyspepsia: Secondary | ICD-10-CM | POA: Insufficient documentation

## 2023-01-31 DIAGNOSIS — F17210 Nicotine dependence, cigarettes, uncomplicated: Secondary | ICD-10-CM | POA: Insufficient documentation

## 2023-01-31 DIAGNOSIS — Z7984 Long term (current) use of oral hypoglycemic drugs: Secondary | ICD-10-CM | POA: Diagnosis not present

## 2023-01-31 DIAGNOSIS — E119 Type 2 diabetes mellitus without complications: Secondary | ICD-10-CM | POA: Diagnosis not present

## 2023-01-31 DIAGNOSIS — K635 Polyp of colon: Secondary | ICD-10-CM | POA: Diagnosis not present

## 2023-01-31 DIAGNOSIS — K319 Disease of stomach and duodenum, unspecified: Secondary | ICD-10-CM

## 2023-01-31 DIAGNOSIS — K644 Residual hemorrhoidal skin tags: Secondary | ICD-10-CM | POA: Insufficient documentation

## 2023-01-31 DIAGNOSIS — K649 Unspecified hemorrhoids: Secondary | ICD-10-CM | POA: Diagnosis not present

## 2023-01-31 DIAGNOSIS — K2970 Gastritis, unspecified, without bleeding: Secondary | ICD-10-CM | POA: Diagnosis not present

## 2023-01-31 DIAGNOSIS — D123 Benign neoplasm of transverse colon: Secondary | ICD-10-CM

## 2023-01-31 DIAGNOSIS — K3189 Other diseases of stomach and duodenum: Secondary | ICD-10-CM

## 2023-01-31 HISTORY — PX: COLONOSCOPY WITH PROPOFOL: SHX5780

## 2023-01-31 HISTORY — PX: ESOPHAGOGASTRODUODENOSCOPY (EGD) WITH PROPOFOL: SHX5813

## 2023-01-31 LAB — GLUCOSE, CAPILLARY: Glucose-Capillary: 244 mg/dL — ABNORMAL HIGH (ref 70–99)

## 2023-01-31 SURGERY — COLONOSCOPY WITH PROPOFOL
Anesthesia: General

## 2023-01-31 MED ORDER — DEXMEDETOMIDINE HCL IN NACL 80 MCG/20ML IV SOLN
INTRAVENOUS | Status: DC | PRN
  Administered 2023-01-31: 8 ug via BUCCAL
  Administered 2023-01-31: 4 ug via BUCCAL

## 2023-01-31 MED ORDER — PROPOFOL 10 MG/ML IV BOLUS
INTRAVENOUS | Status: AC
  Filled 2023-01-31: qty 20

## 2023-01-31 MED ORDER — PROPOFOL 500 MG/50ML IV EMUL
INTRAVENOUS | Status: DC | PRN
  Administered 2023-01-31: 150 ug/kg/min via INTRAVENOUS

## 2023-01-31 MED ORDER — STERILE WATER FOR IRRIGATION IR SOLN
Status: DC | PRN
  Administered 2023-01-31: 1

## 2023-01-31 MED ORDER — PROPOFOL 10 MG/ML IV BOLUS
INTRAVENOUS | Status: DC | PRN
  Administered 2023-01-31: 20 mg via INTRAVENOUS
  Administered 2023-01-31: 60 mg via INTRAVENOUS

## 2023-01-31 MED ORDER — ALBUTEROL SULFATE HFA 108 (90 BASE) MCG/ACT IN AERS
INHALATION_SPRAY | RESPIRATORY_TRACT | Status: DC | PRN
  Administered 2023-01-31: 2 via RESPIRATORY_TRACT

## 2023-01-31 MED ORDER — LIDOCAINE HCL (CARDIAC) PF 100 MG/5ML IV SOSY
PREFILLED_SYRINGE | INTRAVENOUS | Status: DC | PRN
  Administered 2023-01-31: 50 mg via INTRAVENOUS

## 2023-01-31 MED ORDER — SODIUM CHLORIDE 0.9 % IV SOLN
INTRAVENOUS | Status: DC
  Administered 2023-01-31: 20 mL/h via INTRAVENOUS

## 2023-01-31 MED ORDER — MIDAZOLAM HCL 2 MG/2ML IJ SOLN
INTRAMUSCULAR | Status: DC | PRN
  Administered 2023-01-31: 2 mg via INTRAVENOUS

## 2023-01-31 MED ORDER — MIDAZOLAM HCL 2 MG/2ML IJ SOLN
INTRAMUSCULAR | Status: AC
  Filled 2023-01-31: qty 2

## 2023-01-31 NOTE — H&P (Signed)
Cephas Darby, MD [address]  [place]  [place], [place] [address]  Main: [phone]  Fax: [phone] Pager: [phone]  Primary Care Physician:  Services, [place] Primary Gastroenterologist:  Dr. Cephas Darby  Pre-Procedure History & Physical: HPI:  Vincent Morton is a 57 y.o. male is here for an endoscopy and colonoscopy.   Past Medical History:  Diagnosis Date   Diabetes mellitus without complication ([place])     History reviewed. No pertinent surgical history.  Prior to Admission medications   Medication Sig Start Date End Date Taking? Authorizing Provider  ASPIRIN LOW DOSE 81 MG tablet Take 81 mg by mouth daily. 08/20/22  Yes [provider]  atorvastatin (LIPITOR) 40 MG tablet  04/30/21  Yes [provider]  metFORMIN (GLUCOPHAGE-XR) 500 MG 24 hr tablet Take 1,000 mg by mouth 2 (two) times daily. 08/20/22  Yes [provider]  PROAIR HFA 108 (90 Base) MCG/ACT inhaler Inhale 2 puffs into the lungs every 4 (four) hours as needed. 07/10/20  Yes [provider]  sildenafil (REVATIO) 20 MG tablet TAKE 2 TO 5 TABLETS BY MOUTH 1 HOUR PRIOR TO INTERCOURSE. 12/18/20  Yes Stoioff, Ronda Fairly, MD  tamsulosin (FLOMAX) 0.4 MG CAPS capsule Take 0.8 mg by mouth daily. 07/23/20  Yes [provider]    Allergies as of 09/14/2022   (No Known Allergies)    History reviewed. No pertinent family history.  Social History   Socioeconomic History   Marital status: Married    Spouse name: Not on file   Number of children: Not on file   Years of education: Not on file   Highest education level: Not on file  Occupational History   Not on file  Tobacco Use   Smoking status: Every Day    Types: Cigarettes   Smokeless tobacco: Never  Vaping Use   Vaping Use: Never used  Substance and Sexual Activity   Alcohol use: Not Currently    Comment: Last drink 37yrs   Drug use: Yes    Types: Marijuana   Sexual activity: Not on file   Other Topics Concern   Not on file  Social History Narrative   Not on file   Social Determinants of Health   Financial Resource Strain: Not on file  Food Insecurity: Not on file  Transportation Needs: Not on file  Physical Activity: Not on file  Stress: Not on file  Social Connections: Not on file  Intimate Partner Violence: Not on file    Review of Systems: See HPI, otherwise negative ROS  Physical Exam: BP (!) 150/90   Pulse 77   Temp (!) 97.1 F (36.2 C) (Temporal)   Resp 20   Ht 5\' 8"  (1.727 m)   Wt 79.8 kg   SpO2 100%   BMI 26.76 kg/m  General:   Alert,  pleasant and cooperative in NAD Head:  Normocephalic and atraumatic. Neck:  Supple; no masses or thyromegaly. Lungs:  Clear throughout to auscultation.    Heart:  Regular rate and rhythm. Abdomen:  Soft, nontender and nondistended. Normal bowel sounds, without guarding, and without rebound.   Neurologic:  Alert and  oriented x4;  grossly normal neurologically.  Impression/Plan: Unknown Manganelli is here for an endoscopy and colonoscopy to be performed for dyspepsia and generalized abdominal bloating, rectal pressure   Risks, benefits, limitations, and alternatives regarding  endoscopy and colonoscopy have been reviewed with the patient.  Questions have been answered.  All parties agreeable.  Sherri Sear, MD  01/31/2023, 10:06 AM

## 2023-01-31 NOTE — Op Note (Signed)
[facility]astroenterology Patient Name: Vincent Morton Procedure Date: 01/31/2023 10:11 AM MRN: [id number] Account #: [id number] Date of Birth: 1966/03/28 Admit Type: Outpatient Age: 57 Room: [facility] ENDO ROOM 4 Gender: Male Note Status: Finalized Instrument Name: Colonoscope [id number] Procedure:             Colonoscopy Indications:           Rectal pain Providers:             Lin Landsman MD, MD Medicines:             General Anesthesia Complications:         No immediate complications. Estimated blood loss: None. Procedure:             Pre-Anesthesia Assessment:                        - Prior to the procedure, a History and Physical was                         performed, and patient medications and allergies were                         reviewed. The patient is competent. The risks and                         benefits of the procedure and the sedation options and                         risks were discussed with the patient. All questions                         were answered and informed consent was obtained.                         Patient identification and proposed procedure were                         verified by the physician, the nurse, the                         anesthesiologist, the anesthetist and the technician                         in the pre-procedure area in the procedure room in the                         endoscopy suite. Mental Status Examination: alert and                         oriented. Airway Examination: normal oropharyngeal                         airway and neck mobility. Respiratory Examination:                         clear to auscultation. CV Examination: normal.                         Prophylactic Antibiotics: The patient does not  require                         prophylactic antibiotics. Prior Anticoagulants: The                         patient has taken no anticoagulant or antiplatelet                         agents. ASA  Grade Assessment: II - A patient with mild                         systemic disease. After reviewing the risks and                         benefits, the patient was deemed in satisfactory                         condition to undergo the procedure. The anesthesia                         plan was to use general anesthesia. Immediately prior                         to administration of medications, the patient was                         re-assessed for adequacy to receive sedatives. The                         heart rate, respiratory rate, oxygen saturations,                         blood pressure, adequacy of pulmonary ventilation, and                         response to care were monitored throughout the                         procedure. The physical status of the patient was                         re-assessed after the procedure.                        After obtaining informed consent, the colonoscope was                         passed under direct vision. Throughout the procedure,                         the patient's blood pressure, pulse, and oxygen                         saturations were monitored continuously. The                         Colonoscope was introduced through the anus and  advanced to the the cecum, identified by appendiceal                         orifice and ileocecal valve. The colonoscopy was                         performed without difficulty. The patient tolerated                         the procedure well. The quality of the bowel                         preparation was evaluated using the BBPS [facility] Bowel                         Preparation Scale) with scores of: Right Colon = 2                         (minor amount of residual staining, small fragments of                         stool and/or opaque liquid, but mucosa seen well),                         Transverse Colon = 3 (entire mucosa seen well with no                          residual staining, small fragments of stool or opaque                         liquid) and Left Colon = 3 (entire mucosa seen well                         with no residual staining, small fragments of stool or                         opaque liquid). The total BBPS score equals 8. The                         ileocecal valve, appendiceal orifice, and rectum were                         photographed. Findings:      The perianal and digital rectal examinations were normal. Pertinent       negatives include normal sphincter tone and no palpable rectal lesions.      An 8 mm polyp was found in the transverse colon. The polyp was sessile.       The polyp was removed with a cold snare. Resection and retrieval were       complete. Estimated blood loss: none.      Non-bleeding external hemorrhoids were found during retroflexion. The       hemorrhoids were large. Impression:            - One 8 mm polyp in the transverse colon, removed with  a cold snare. Resected and retrieved.                        - Non-bleeding external hemorrhoids. Recommendation:        - Discharge patient to home (with escort).                        - Resume previous diet today.                        - Continue present medications.                        - Await pathology results.                        - Repeat colonoscopy in 3 years for surveillance.                        - Return to my office at appointment to be scheduled                         to discuss about hemorrhoid banding . Procedure Code(s):     --- Professional ---                        [phone], Colonoscopy, flexible; with removal of                         tumor(s), polyp(s), or other lesion(s) by snare                         technique Diagnosis Code(s):     --- Professional ---                        D12.3, Benign neoplasm of transverse colon (hepatic                         flexure or splenic flexure)                        K64.4,  Residual hemorrhoidal skin tags                        K62.89, Other specified diseases of anus and rectum CPT copyright 2022 American Medical Association. All rights reserved. The codes documented in this report are preliminary and upon coder review may  be revised to meet current compliance requirements. Dr. Ulyess Mort Lin Landsman MD, MD 01/31/2023 11:06:20 AM This report has been signed electronically. Number of Addenda: 0 Note Initiated On: 01/31/2023 10:11 AM Scope Withdrawal Time: 0 hours 10 minutes 58 seconds  Total Procedure Duration: 0 hours 12 minutes 41 seconds  Estimated Blood Loss:  Estimated blood loss: none.      [facility]

## 2023-01-31 NOTE — Anesthesia Procedure Notes (Signed)
Procedure Name: MAC Date/Time: 01/31/2023 10:40 AM  Performed by: Jerrye Noble, CRNAPre-anesthesia Checklist: Patient identified, Emergency Drugs available, Suction available and Patient being monitored Patient Re-evaluated:Patient Re-evaluated prior to induction Oxygen Delivery Method: Nasal cannula

## 2023-01-31 NOTE — Telephone Encounter (Signed)
Called and left a message for call back  

## 2023-01-31 NOTE — Telephone Encounter (Signed)
Patient returned your call and is requesting a call back.

## 2023-01-31 NOTE — Telephone Encounter (Signed)
Patient wife is calling because she states they need a note for work today and tomorrow. Informed her we could only do today and not tomorrow. She states the nurse in endo said that he could not go back to work tomorrow. Informed her she needed to call the endo department then and gave her the number.

## 2023-01-31 NOTE — Telephone Encounter (Signed)
Made appointment for 02/23/2023 at 3:30

## 2023-01-31 NOTE — Op Note (Signed)
[facility] Gastroenterology Patient Name: Vincent Morton Procedure Date: 01/31/2023 10:10 AM MRN: [id number] Account #: [id number] Date of Birth: [birth year] Admit Type: Outpatient Age: 57 Room: [facility] ENDO ROOM 4 Gender: Male Note Status: Finalized Instrument Name: Upper Endoscope [id number] Procedure:             Upper GI endoscopy Indications:           Indigestion Providers:             Lin Landsman MD, MD Medicines:             General Anesthesia Complications:         No immediate complications. Estimated blood loss:                         Minimal. Procedure:             Pre-Anesthesia Assessment:                        - Prior to the procedure, a History and Physical was                         performed, and patient medications and allergies were                         reviewed. The patient is competent. The risks and                         benefits of the procedure and the sedation options and                         risks were discussed with the patient. All questions                         were answered and informed consent was obtained.                         Patient identification and proposed procedure were                         verified by the physician, the nurse, the                         anesthesiologist, the anesthetist and the technician                         in the pre-procedure area in the procedure room in the                         endoscopy suite. Mental Status Examination: alert and                         oriented. Airway Examination: normal oropharyngeal                         airway and neck mobility. Respiratory Examination:                         clear to auscultation. CV Examination: normal.  Prophylactic Antibiotics: The patient does not require                         prophylactic antibiotics. Prior Anticoagulants: The                         patient has taken no anticoagulant or antiplatelet                          agents. ASA Grade Assessment: II - A patient with mild                         systemic disease. After reviewing the risks and                         benefits, the patient was deemed in satisfactory                         condition to undergo the procedure. The anesthesia                         plan was to use general anesthesia. Immediately prior                         to administration of medications, the patient was                         re-assessed for adequacy to receive sedatives. The                         heart rate, respiratory rate, oxygen saturations,                         blood pressure, adequacy of pulmonary ventilation, and                         response to care were monitored throughout the                         procedure. The physical status of the patient was                         re-assessed after the procedure.                        After obtaining informed consent, the endoscope was                         passed under direct vision. Throughout the procedure,                         the patient's blood pressure, pulse, and oxygen                         saturations were monitored continuously. The Endoscope                         was introduced through the mouth, and advanced to the  second part of duodenum. The upper GI endoscopy was                         accomplished without difficulty. The patient tolerated                         the procedure well. Findings:      The duodenal bulb and second portion of the duodenum were normal.      Diffuse moderately erythematous mucosa without bleeding was found in the       gastric antrum. Biopsies were taken with a cold forceps for Helicobacter       pylori testing.      A single 10 mm submucosal nodule with no bleeding and no stigmata of       recent bleeding was found on the greater curvature of the gastric body.       The nodule was Paris classification Is  (protruding, sessile). Biopsies       were taken with a cold forceps for histology. Estimated blood loss was       minimal. To prevent bleeding after the biopsy, one hemostatic clip was       successfully placed (MR safe). Clip manufacturer: [employer].       There was no bleeding at the end of the procedure.      The cardia and gastric fundus were normal on retroflexion.      The gastroesophageal junction and examined esophagus were normal. Impression:            - Normal duodenal bulb and second portion of the                         duodenum.                        - Erythematous mucosa in the antrum. Biopsied.                        - A single submucosal papule (nodule) found in the                         stomach. Biopsied. Clip manufacturer: [employer]. Clip (MR safe) was placed.                        - Normal gastroesophageal junction and esophagus. Recommendation:        - Perform an upper endoscopic ultrasound (UEUS) at                         appointment to be scheduled after the pathology                         results.                        - Await pathology results. Procedure Code(s):     --- Professional ---                        [phone], Esophagogastroduodenoscopy, flexible,  transoral; with biopsy, single or multiple Diagnosis Code(s):     --- Professional ---                        K31.89, Other diseases of stomach and duodenum                        K30, Functional dyspepsia CPT copyright 2022 American Medical Association. All rights reserved. The codes documented in this report are preliminary and upon coder review may  be revised to meet current compliance requirements. Dr. Ulyess Mort Lin Landsman MD, MD 01/31/2023 10:47:56 AM This report has been signed electronically. Number of Addenda: 0 Note Initiated On: 01/31/2023 10:10 AM Estimated Blood Loss:  Estimated blood loss was minimal.       [facility]

## 2023-01-31 NOTE — Transfer of Care (Signed)
Immediate Anesthesia Transfer of Care Note  Patient: Vincent Morton  Procedure(s) Performed: COLONOSCOPY WITH PROPOFOL ESOPHAGOGASTRODUODENOSCOPY (EGD) WITH PROPOFOL  Patient Location: PACU and Endoscopy Unit  Anesthesia Type:General  Level of Consciousness: drowsy and patient cooperative  Airway & Oxygen Therapy: Patient Spontanous Breathing  Post-op Assessment: Report given to RN and Post -op Vital signs reviewed and stable  Post vital signs: Reviewed and stable  Last Vitals:  Vitals Value Taken Time  BP 94/68 01/31/23 1106  Temp    Pulse 78 01/31/23 1106  Resp 16 01/31/23 1106  SpO2 93 % 01/31/23 1106  Vitals shown include unvalidated device data.  Last Pain:  Vitals:   01/31/23 0956  TempSrc: Temporal  PainSc: 0-No pain         Complications: No notable events documented.

## 2023-01-31 NOTE — Anesthesia Postprocedure Evaluation (Signed)
Anesthesia Post Note  Patient: Vincent Morton  Procedure(s) Performed: COLONOSCOPY WITH PROPOFOL ESOPHAGOGASTRODUODENOSCOPY (EGD) WITH PROPOFOL  Patient location during evaluation: Endoscopy Anesthesia Type: General Level of consciousness: awake and alert Pain management: pain level controlled Vital Signs Assessment: post-procedure vital signs reviewed and stable Respiratory status: spontaneous breathing, nonlabored ventilation, respiratory function stable and patient connected to nasal cannula oxygen Cardiovascular status: blood pressure returned to baseline and stable Postop Assessment: no apparent nausea or vomiting Anesthetic complications: no   No notable events documented.   Last Vitals:  Vitals:   01/31/23 1126 01/31/23 1136  BP:    Pulse: 72 72  Resp:    Temp:    SpO2:  96%    Last Pain:  Vitals:   01/31/23 1136  TempSrc:   PainSc: 0-No pain                 Arita Miss

## 2023-01-31 NOTE — Telephone Encounter (Signed)
-----   Message from Lin Landsman, MD sent at 01/31/2023 11:18 AM EDT ----- Regarding: hemorrhoid banding Please schedule office visit to discuss about hemorrhoid banding  RV

## 2023-01-31 NOTE — Anesthesia Preprocedure Evaluation (Signed)
Anesthesia Evaluation  Patient identified by MRN, date of birth, ID band Patient awake    Reviewed: Allergy & Precautions, NPO status , Patient's Chart, lab work & pertinent test results  History of Anesthesia Complications Negative for: history of anesthetic complications  Airway Mallampati: II  TM Distance: >3 FB Neck ROM: Full    Dental  (+) Poor Dentition, Missing, Chipped   Pulmonary asthma , neg sleep apnea, neg COPD, Current SmokerPatient did not abstain from smoking.  Light scattered wheezes b/l   + wheezing      Cardiovascular Exercise Tolerance: Good METS(-) hypertension(-) CAD and (-) Past MI negative cardio ROS (-) dysrhythmias  Rhythm:Regular Rate:Normal - Systolic murmurs    Neuro/Psych negative neurological ROS  negative psych ROS   GI/Hepatic ,neg GERD  ,,(+)     (-) substance abuse    Endo/Other  diabetes, Oral Hypoglycemic Agents    Renal/GU negative Renal ROS     Musculoskeletal   Abdominal   Peds  Hematology   Anesthesia Other Findings Past Medical History: No date: Diabetes mellitus without complication (HCC)  Reproductive/Obstetrics                             Anesthesia Physical Anesthesia Plan  ASA: 2  Anesthesia Plan: General   Post-op Pain Management: Minimal or no pain anticipated   Induction: Intravenous  PONV Risk Score and Plan: 1 and Propofol infusion, TIVA and Ondansetron  Airway Management Planned: Nasal Cannula  Additional Equipment: None  Intra-op Plan:   Post-operative Plan:   Informed Consent: I have reviewed the patients History and Physical, chart, labs and discussed the procedure including the risks, benefits and alternatives for the proposed anesthesia with the patient or authorized representative who has indicated his/her understanding and acceptance.     Dental advisory given  Plan Discussed with: CRNA and  Surgeon  Anesthesia Plan Comments: (Discussed risks of anesthesia with patient, including possibility of difficulty with spontaneous ventilation under anesthesia necessitating airway intervention, PONV, and rare risks such as cardiac or respiratory or neurological events, and allergic reactions. Discussed the role of CRNA in patient's perioperative care. Patient understands. Patient counseled on benefits of smoking cessation, and increased perioperative risks associated with continued smoking. )       Anesthesia Quick Evaluation

## 2023-02-01 ENCOUNTER — Encounter: Payer: Self-pay | Admitting: Gastroenterology

## 2023-02-01 LAB — SURGICAL PATHOLOGY

## 2023-02-08 ENCOUNTER — Telehealth: Payer: Self-pay

## 2023-02-08 NOTE — Telephone Encounter (Signed)
Patient and his wife are calling because they state they have not heard anything back from his colonoscopy and EGD. Informed them that Dr. Marius Ditch sent a letter to his mychart account and I read to them what the letter said. They said they do not know the password for the mychart account. They asked if we could rest the password. Reset the password to [phone] and informed them they could reset the password after they get login.

## 2023-02-23 ENCOUNTER — Ambulatory Visit: Payer: BC Managed Care – PPO | Admitting: Gastroenterology

## 2023-03-03 ENCOUNTER — Ambulatory Visit: Payer: Medicaid Other | Admitting: Nurse Practitioner

## 2023-05-23 DIAGNOSIS — Z1331 Encounter for screening for depression: Secondary | ICD-10-CM | POA: Diagnosis not present

## 2023-05-23 DIAGNOSIS — E119 Type 2 diabetes mellitus without complications: Secondary | ICD-10-CM | POA: Diagnosis not present

## 2023-05-23 DIAGNOSIS — Z0000 Encounter for general adult medical examination without abnormal findings: Secondary | ICD-10-CM | POA: Diagnosis not present

## 2023-05-23 DIAGNOSIS — Z1389 Encounter for screening for other disorder: Secondary | ICD-10-CM | POA: Diagnosis not present

## 2023-05-23 DIAGNOSIS — K047 Periapical abscess without sinus: Secondary | ICD-10-CM | POA: Diagnosis not present

## 2023-05-26 DIAGNOSIS — E119 Type 2 diabetes mellitus without complications: Secondary | ICD-10-CM | POA: Diagnosis not present

## 2023-05-26 DIAGNOSIS — R972 Elevated prostate specific antigen [PSA]: Secondary | ICD-10-CM | POA: Diagnosis not present

## 2023-05-26 DIAGNOSIS — Z1389 Encounter for screening for other disorder: Secondary | ICD-10-CM | POA: Diagnosis not present

## 2023-07-13 DIAGNOSIS — K029 Dental caries, unspecified: Secondary | ICD-10-CM | POA: Diagnosis not present

## 2023-07-13 DIAGNOSIS — K136 Irritative hyperplasia of oral mucosa: Secondary | ICD-10-CM | POA: Diagnosis not present

## 2023-07-14 DIAGNOSIS — K136 Irritative hyperplasia of oral mucosa: Secondary | ICD-10-CM | POA: Diagnosis not present

## 2023-11-15 ENCOUNTER — Ambulatory Visit: Payer: BC Managed Care – PPO | Admitting: Nurse Practitioner

## 2023-11-16 DIAGNOSIS — Z1389 Encounter for screening for other disorder: Secondary | ICD-10-CM | POA: Diagnosis not present

## 2023-11-16 DIAGNOSIS — Z125 Encounter for screening for malignant neoplasm of prostate: Secondary | ICD-10-CM | POA: Diagnosis not present

## 2023-11-16 DIAGNOSIS — Z0000 Encounter for general adult medical examination without abnormal findings: Secondary | ICD-10-CM | POA: Diagnosis not present

## 2023-11-16 DIAGNOSIS — N400 Enlarged prostate without lower urinary tract symptoms: Secondary | ICD-10-CM | POA: Diagnosis not present

## 2023-11-16 DIAGNOSIS — Z712 Person consulting for explanation of examination or test findings: Secondary | ICD-10-CM | POA: Diagnosis not present

## 2023-11-16 DIAGNOSIS — E119 Type 2 diabetes mellitus without complications: Secondary | ICD-10-CM | POA: Diagnosis not present

## 2023-11-17 DIAGNOSIS — R350 Frequency of micturition: Secondary | ICD-10-CM | POA: Diagnosis not present

## 2023-11-22 ENCOUNTER — Other Ambulatory Visit: Payer: Self-pay

## 2023-11-22 ENCOUNTER — Emergency Department
Admission: EM | Admit: 2023-11-22 | Discharge: 2023-11-22 | Disposition: A | Discharge status: Home / Self Care | Payer: BC Managed Care – PPO | Attending: Emergency Medicine | Admitting: Emergency Medicine

## 2023-11-22 DIAGNOSIS — K5792 Diverticulitis of intestine, part unspecified, without perforation or abscess without bleeding: Secondary | ICD-10-CM | POA: Diagnosis not present

## 2023-11-22 DIAGNOSIS — K5732 Diverticulitis of large intestine without perforation or abscess without bleeding: Secondary | ICD-10-CM | POA: Insufficient documentation

## 2023-11-22 DIAGNOSIS — R1030 Lower abdominal pain, unspecified: Secondary | ICD-10-CM | POA: Diagnosis not present

## 2023-11-22 LAB — CBC
HCT: 43.6 % (ref 39.0–52.0)
Hemoglobin: 14.6 g/dL (ref 13.0–17.0)
MCH: 28.7 pg (ref 26.0–34.0)
MCHC: 33.5 g/dL (ref 30.0–36.0)
MCV: 85.8 fL (ref 80.0–100.0)
Platelets: 158 10*3/uL (ref 150–400)
RBC: 5.08 MIL/uL (ref 4.22–5.81)
RDW: 13.4 % (ref 11.5–15.5)
WBC: 7 10*3/uL (ref 4.0–10.5)
nRBC: 0 % (ref 0.0–0.2)

## 2023-11-22 LAB — COMPREHENSIVE METABOLIC PANEL
ALT: 29 U/L (ref 0–44)
AST: 21 U/L (ref 15–41)
Albumin: 3.9 g/dL (ref 3.5–5.0)
Alkaline Phosphatase: 67 U/L (ref 38–126)
Anion gap: 9 (ref 5–15)
BUN: 14 mg/dL (ref 6–20)
CO2: 21 mmol/L — ABNORMAL LOW (ref 22–32)
Calcium: 8.9 mg/dL (ref 8.9–10.3)
Chloride: 107 mmol/L (ref 98–111)
Creatinine, Ser: 1.17 mg/dL (ref 0.61–1.24)
GFR, Estimated: >60 mL/min (ref >60)
Glucose, Bld: 255 mg/dL — ABNORMAL HIGH (ref 70–99)
Potassium: 4.1 mmol/L (ref 3.5–5.1)
Sodium: 137 mmol/L (ref 135–145)
Total Bilirubin: 0.9 mg/dL (ref 0.0–1.2)
Total Protein: 6.8 g/dL (ref 6.5–8.1)

## 2023-11-22 LAB — LIPASE, BLOOD: Lipase: 32 U/L (ref 11–51)

## 2023-11-22 MED ORDER — AMOXICILLIN-POT CLAVULANATE 875-125 MG PO TABS: 1.0000 | ORAL_TABLET | Freq: Two times a day (BID) | ORAL | 0 refills | Status: AC

## 2023-11-22 NOTE — ED Provider Notes (Signed)
 [facility] Provider Note    Event Date/Time   First MD Initiated Contact with Patient 11/22/23 1221     (approximate)   History   Abdominal Pain   HPI  Vincent Morton is a 58 y.o. male who presents with complaints of lower abdominal pain over the last few days, he thinks this feels similar to when he has had diverticulitis in the past.  He reports he started taking some amoxicillin  but ran out and that was helping his symptoms.  He denies nausea or vomiting.  Normal stools.  No fevers reported.  He is diabetic     Physical Exam   Triage Vital Signs: ED Triage Vitals [11/22/23 0927]  Encounter Vitals Group     BP (!) 150/88     Systolic BP Percentile      Diastolic BP Percentile      Pulse Rate 76     Resp 17     Temp 98.1 F (36.7 C)     Temp Source Oral     SpO2 100 %     Weight 81.6 kg (180 lb)     Height 1.727 m (5' 8)     Head Circumference      Peak Flow      Pain Score 1     Pain Loc      Pain Education      Exclude from Growth Chart     Most recent vital signs: Vitals:   11/22/23 0927  BP: (!) 150/88  Pulse: 76  Resp: 17  Temp: 98.1 F (36.7 C)  SpO2: 100%     General: Awake, no distress.  CV:  Good peripheral perfusion.  Resp:  Normal effort.  Abd:  No distention.  Soft, nontender Other:     ED Results / Procedures / Treatments   Labs (all labs ordered are listed, but only abnormal results are displayed) Labs Reviewed  COMPREHENSIVE METABOLIC PANEL - Abnormal; Notable for the following components:      Result Value   CO2 21 (*)    Glucose, Bld 255 (*)    All other components within normal limits  LIPASE, BLOOD  CBC  URINALYSIS, ROUTINE W REFLEX MICROSCOPIC     EKG     RADIOLOGY     PROCEDURES:  Critical Care performed:   Procedures   MEDICATIONS ORDERED IN ED: Medications - No data to display   IMPRESSION / MDM / ASSESSMENT AND PLAN / ED COURSE  I reviewed the triage vital signs and  the nursing notes. Patient's presentation is most consistent with acute presentation with potential threat to life or bodily function.  Patient presents with lower abdominal discomfort as detailed above, differential includes diverticulitis, colitis, no dysuria to suggest UTI, not consistent with kidney stone  Offered CT imaging however the patient declined, he would prefer to continue with antibiotics because he thinks that was improving his symptoms, will prescribe Augmentin , strict return precautions, patient agrees with this plan.          FINAL CLINICAL IMPRESSION(S) / ED DIAGNOSES   Final diagnoses:  Diverticulitis     Rx / DC Orders   ED Discharge Orders          Ordered    amoxicillin -clavulanate (AUGMENTIN ) 875-125 MG tablet  2 times daily        11/22/23 1230    Ambulatory referral to Gastroenterology        11/22/23 1230  Note:  This document was prepared using Dragon voice recognition software and may include unintentional dictation errors.   Arlander Charleston, MD 11/22/23 [phone]

## 2023-11-22 NOTE — ED Triage Notes (Signed)
 Pt comes in today via POV with complaints of abdominal pain the past couple of weeks. Pt has a history of diverticulitis, and feels like this could possibly be the same thing. Pt also states that he has been taking a medication for his diabetes for the past month and noticed that his stomach has been hurting since. Pt is alert and oriented x4, with no signs of acute distress at this time.

## 2023-12-21 ENCOUNTER — Encounter: Payer: Self-pay | Admitting: Nurse Practitioner

## 2023-12-21 ENCOUNTER — Ambulatory Visit (INDEPENDENT_AMBULATORY_CARE_PROVIDER_SITE_OTHER): Payer: BC Managed Care – PPO | Admitting: Nurse Practitioner

## 2023-12-21 VITALS — BP 140/86 | HR 82 | Temp 98.4°F | Resp 16 | Ht 68.0 in | Wt 183.4 lb

## 2023-12-21 DIAGNOSIS — E1159 Type 2 diabetes mellitus with other circulatory complications: Secondary | ICD-10-CM

## 2023-12-21 DIAGNOSIS — F17210 Nicotine dependence, cigarettes, uncomplicated: Secondary | ICD-10-CM

## 2023-12-21 DIAGNOSIS — Z125 Encounter for screening for malignant neoplasm of prostate: Secondary | ICD-10-CM

## 2023-12-21 DIAGNOSIS — E1169 Type 2 diabetes mellitus with other specified complication: Secondary | ICD-10-CM | POA: Diagnosis not present

## 2023-12-21 DIAGNOSIS — R7301 Impaired fasting glucose: Secondary | ICD-10-CM | POA: Diagnosis not present

## 2023-12-21 DIAGNOSIS — I152 Hypertension secondary to endocrine disorders: Secondary | ICD-10-CM

## 2023-12-21 DIAGNOSIS — E782 Mixed hyperlipidemia: Secondary | ICD-10-CM

## 2023-12-21 DIAGNOSIS — E559 Vitamin D deficiency, unspecified: Secondary | ICD-10-CM | POA: Diagnosis not present

## 2023-12-21 DIAGNOSIS — E785 Hyperlipidemia, unspecified: Secondary | ICD-10-CM

## 2023-12-21 LAB — POCT GLYCOSYLATED HEMOGLOBIN (HGB A1C): Hemoglobin A1C: 8.2 % — AB (ref 4.0–5.6)

## 2023-12-21 MED ORDER — EMPAGLIFLOZIN 25 MG PO TABS: 25.0000 mg | ORAL_TABLET | Freq: Every day | ORAL | 5 refills | Status: DC

## 2023-12-21 NOTE — Progress Notes (Signed)
 [facility] [address] [place], [place] [address]  Internal MEDICINE  Office Visit Note  Patient Name: Vincent Morton  [id number]  [id number]  Date of Service: 12/21/2023   Complaints/HPI Pt is here for establishment of PCP. Chief Complaint  Patient presents with   Diabetes   New Patient (Initial Visit)    HPI Dieter presents for a new patient visit to establish care.  Well-appearing 58 y.o. male with diabetes, high cholesterol, BPH, diverticulosis, and erectile dysfunction, and HSV type 2.  Work: [employer],  Home: live at home with wife  Diet: not following diabetic diet.  Exercise: gets physical activity at job  Tobacco use: 1 ppd for 30 years Alcohol use: 1 pint of henessy (cognac) sometimes, unsure how often Illicit drug use: sometimes smoking marijuana but not currently. Last time was about a week ago.  Routine CRC screening: has a referral to GI, last colonoscopy was done in 2024. Not due again until 2027 Eye exam: once a year, has a clinic he goes to  foot exam: done with annual exam  Labs: due for routine labs  New or worsening pain: abdominal pain related to recurrent episodes of diverticulitis.  Diabetes -- A1c is 8.2 Abdominal pain  Disappearing left testicle --has had multiple ultrasounds done on his scrotum High cholesterol  Recent diverticulitis    Current Medication: Outpatient Encounter Medications as of 12/21/2023  Medication Sig   ASPIRIN LOW DOSE 81 MG tablet Take 81 mg by mouth daily.   atorvastatin (LIPITOR) 40 MG tablet    empagliflozin (JARDIANCE) 25 MG TABS tablet Take 1 tablet (25 mg total) by mouth daily before breakfast.   metFORMIN (GLUCOPHAGE-XR) 500 MG 24 hr tablet Take 1,000 mg by mouth 2 (two) times daily.   PROAIR HFA 108 (90 Base) MCG/ACT inhaler Inhale 2 puffs into the lungs every 4 (four) hours as needed.   sildenafil (REVATIO) 20 MG tablet TAKE 2 TO 5 TABLETS BY MOUTH 1 HOUR PRIOR TO INTERCOURSE.    tamsulosin (FLOMAX) 0.4 MG CAPS capsule Take 0.8 mg by mouth daily.   [DISCONTINUED] empagliflozin (JARDIANCE) 10 MG TABS tablet    No facility-administered encounter medications on file as of 12/21/2023.    Surgical History: Past Surgical History:  Procedure Laterality Date   COLONOSCOPY WITH PROPOFOL N/A 01/31/2023   Procedure: COLONOSCOPY WITH PROPOFOL;  Surgeon: Toney Reil, MD;  Location: [facility] ENDOSCOPY;  Service: Gastroenterology;  Laterality: N/A;   ESOPHAGOGASTRODUODENOSCOPY (EGD) WITH PROPOFOL N/A 01/31/2023   Procedure: ESOPHAGOGASTRODUODENOSCOPY (EGD) WITH PROPOFOL;  Surgeon: Toney Reil, MD;  Location: [facility] ENDOSCOPY;  Service: Gastroenterology;  Laterality: N/A;    Medical History: Past Medical History:  Diagnosis Date   Diabetes mellitus without complication (HCC)     Family History: History reviewed. No pertinent family history.  Social History   Socioeconomic History   Marital status: Married    Spouse name: Not on file   Number of children: Not on file   Years of education: Not on file   Highest education level: Not on file  Occupational History   Not on file  Tobacco Use   Smoking status: Every Day    Types: Cigarettes   Smokeless tobacco: Never  Vaping Use   Vaping status: Never Used  Substance and Sexual Activity   Alcohol use: Not Currently    Comment: Last drink 69yrs   Drug use: Yes    Types: Marijuana   Sexual activity: Not on file  Other Topics  Concern   Not on file  Social History Narrative   Not on file   Social Drivers of Health   Financial Resource Strain: Not on file  Food Insecurity: Not on file  Transportation Needs: Not on file  Physical Activity: Not on file  Stress: Not on file  Social Connections: Not on file  Intimate Partner Violence: Not on file     Review of Systems  Constitutional:  Positive for fatigue. Negative for chills and unexpected weight change.  HENT:  Positive for postnasal drip. Negative  for congestion, rhinorrhea, sneezing and sore throat.   Eyes:  Negative for redness.  Respiratory:  Positive for shortness of breath. Negative for cough, chest tightness and wheezing.   Cardiovascular:  Negative for chest pain and palpitations.  Gastrointestinal:  Positive for abdominal distention and abdominal pain. Negative for constipation, diarrhea, nausea and vomiting.  Endocrine: Positive for polydipsia, polyphagia and polyuria.  Genitourinary:  Negative for dysuria and frequency.  Musculoskeletal:  Negative for arthralgias, back pain, joint swelling and neck pain.  Skin:  Negative for rash.  Neurological: Negative.  Negative for tremors and numbness.  Hematological:  Negative for adenopathy. Does not bruise/bleed easily.  Psychiatric/Behavioral:  Negative for behavioral problems (Depression), self-injury, sleep disturbance and suicidal ideas. The patient is not nervous/anxious.     Vital Signs: BP (!) 140/86   Pulse 82   Temp 98.4 F (36.9 C)   Resp 16   Ht 5\' 8"  (1.727 m)   Wt 183 lb 6.4 oz (83.2 kg)   SpO2 98%   BMI 27.89 kg/m    Physical Exam Vitals reviewed.  Constitutional:      General: He is not in acute distress.    Appearance: Normal appearance. He is obese. He is not ill-appearing.  HENT:     Head: Normocephalic and atraumatic.  Eyes:     Pupils: Pupils are equal, round, and reactive to light.  Cardiovascular:     Rate and Rhythm: Normal rate and regular rhythm.  Pulmonary:     Effort: Pulmonary effort is normal. No respiratory distress.  Neurological:     Mental Status: He is alert and oriented to person, place, and time.  Psychiatric:        Mood and Affect: Mood normal.        Behavior: Behavior normal.       Assessment/Plan: 1. Type 2 diabetes mellitus with other specified complication, without long-term current use of insulin (HCC) (Primary) A1c is elevated at 8.2. continue jardiance as prescribed. Routine labs ordered for cholesterol and  urine microalbumin/creatinine ratio.  - empagliflozin (JARDIANCE) 25 MG TABS tablet; Take 1 tablet (25 mg total) by mouth daily before breakfast.  Dispense: 30 tablet; Refill: 5 - Lipid Profile - Urine Microalbumin w/creat. ratio  2. Hypertension associated with type 2 diabetes mellitus (HCC) Not currently on any BP medications, will reassess blood pressure at next visit.   3. Hyperlipidemia associated with type 2 diabetes mellitus (HCC) Routine labs ordered  - Lipid Profile  4. Vitamin D deficiency Routine lab ordered  - Vitamin D (25 hydroxy)  5. Impaired fasting glucose A1c checked due to history of ED visits for high blood glucose - POCT glycosylated hemoglobin (Hb A1C)  6. Screening for prostate cancer Routine PSA lab ordered  - PSA Total (Reflex To Free)  7. Smokes 1 pack of cigarettes per day Routine lung cancer screening ordered - CT CHEST LUNG CA SCREEN LOW DOSE W/O CM; Future  General Counseling: Vinie Sill understanding of the findings of todays visit and agrees with plan of treatment. I have discussed any further diagnostic evaluation that may be needed or ordered today. We also reviewed his medications today. he has been encouraged to call the office with any questions or concerns that should arise related to todays visit.    Orders Placed This Encounter  Procedures   Lipid Profile   Vitamin D (25 hydroxy)   PSA Total (Reflex To Free)   POCT glycosylated hemoglobin (Hb A1C)    Meds ordered this encounter  Medications   empagliflozin (JARDIANCE) 25 MG TABS tablet    Sig: Take 1 tablet (25 mg total) by mouth daily before breakfast.    Dispense:  30 tablet    Refill:  5    Fill new script today, discontinue 10 mg tablet.    Return for CPE, Chesky Heyer PCP at earliest available opening, have labs done prior to office visit. .  Time spent:30 Minutes Time spent with patient included reviewing progress notes, labs, imaging studies, and discussing plan  for follow up.   [place] Controlled Substance Database was reviewed by me for overdose risk score (ORS)   This patient was seen by Sallyanne Kuster, FNP-C in collaboration with Dr. Beverely Risen as a part of collaborative care agreement.   Lavere Stork R. Tedd Sias, MSN, FNP-C Internal Medicine

## 2023-12-22 ENCOUNTER — Telehealth: Payer: Self-pay | Admitting: Nurse Practitioner

## 2023-12-22 NOTE — Telephone Encounter (Signed)
Faxed MR request to [facility]; [phone]. Scanned-Toni

## 2024-01-10 ENCOUNTER — Other Ambulatory Visit: Payer: Self-pay

## 2024-01-10 MED ORDER — TAMSULOSIN HCL 0.4 MG PO CAPS: 0.8000 mg | ORAL_CAPSULE | Freq: Every day | ORAL | 0 refills | Status: DC

## 2024-01-10 MED ORDER — ATORVASTATIN CALCIUM 40 MG PO TABS: ORAL_TABLET | ORAL | 0 refills | Status: DC

## 2024-01-10 MED ORDER — METFORMIN HCL ER 500 MG PO TB24: 1000.0000 mg | ORAL_TABLET | Freq: Two times a day (BID) | ORAL | 0 refills | Status: DC

## 2024-01-11 ENCOUNTER — Encounter: Payer: Self-pay | Admitting: Nurse Practitioner

## 2024-01-11 ENCOUNTER — Ambulatory Visit: Admitting: Nurse Practitioner

## 2024-01-11 VITALS — BP 125/75 | HR 86 | Temp 97.8°F | Resp 16 | Ht 68.0 in | Wt 179.0 lb

## 2024-01-11 DIAGNOSIS — J208 Acute bronchitis due to other specified organisms: Secondary | ICD-10-CM | POA: Diagnosis not present

## 2024-01-11 DIAGNOSIS — R062 Wheezing: Secondary | ICD-10-CM

## 2024-01-11 DIAGNOSIS — R051 Acute cough: Secondary | ICD-10-CM | POA: Diagnosis not present

## 2024-01-11 DIAGNOSIS — B9689 Other specified bacterial agents as the cause of diseases classified elsewhere: Secondary | ICD-10-CM

## 2024-01-11 MED ORDER — HYDROCOD POLI-CHLORPHE POLI ER 10-8 MG/5ML PO SUER: 5.0000 mL | Freq: Two times a day (BID) | ORAL | 0 refills | Status: AC | PRN

## 2024-01-11 MED ORDER — LEVOFLOXACIN 750 MG PO TABS: 750.0000 mg | ORAL_TABLET | Freq: Every day | ORAL | 0 refills | Status: DC

## 2024-01-11 MED ORDER — PREDNISONE 10 MG (21) PO TBPK: ORAL_TABLET | ORAL | 0 refills | Status: DC

## 2024-01-11 NOTE — Progress Notes (Signed)
 [facility] [address] [place], [place] [address]  Internal MEDICINE  Office Visit Note  Patient Name: Vincent Morton  [id number]  [id number]  Date of Service: 01/11/2024  Chief Complaint  Patient presents with   Cough    Covid and flu test is negative    Acute Visit   Sinusitis     HPI Vincent Morton presents for an acute sick visit for symptoms of URI --onset of symptoms was 8 days ago. Negative for covid.  --reports fatigue, cough, nasal congestion, runny nose, sore throat, sinus drainage, chest tightness, poor sleep,  --has tried alka-seltzer plus, left over tamiflu, mucinex DM, dayquil, nyquil, tylenol     Current Medication:  Outpatient Encounter Medications as of 01/11/2024  Medication Sig   ASPIRIN LOW DOSE 81 MG tablet Take 81 mg by mouth daily.   atorvastatin (LIPITOR) 40 MG tablet Take 1 tab po at bedtime   chlorpheniramine-HYDROcodone (TUSSIONEX) 10-8 MG/5ML Take 5 mLs by mouth every 12 (twelve) hours as needed.   empagliflozin (JARDIANCE) 25 MG TABS tablet Take 1 tablet (25 mg total) by mouth daily before breakfast.   levofloxacin (LEVAQUIN) 750 MG tablet Take 1 tablet (750 mg total) by mouth daily. Take with food   metFORMIN (GLUCOPHAGE-XR) 500 MG 24 hr tablet Take 2 tablets (1,000 mg total) by mouth 2 (two) times daily.   predniSONE (STERAPRED UNI-PAK 21 TAB) 10 MG (21) TBPK tablet Use as directed for 6 days   PROAIR HFA 108 (90 Base) MCG/ACT inhaler Inhale 2 puffs into the lungs every 4 (four) hours as needed.   sildenafil (REVATIO) 20 MG tablet TAKE 2 TO 5 TABLETS BY MOUTH 1 HOUR PRIOR TO INTERCOURSE.   tamsulosin (FLOMAX) 0.4 MG CAPS capsule Take 2 capsules (0.8 mg total) by mouth daily.   No facility-administered encounter medications on file as of 01/11/2024.      Medical History: Past Medical History:  Diagnosis Date   Diabetes mellitus without complication (HCC)      Vital Signs: BP 125/75   Pulse 86   Temp 97.8 F (36.6 C)   Resp 16    Ht 5\' 8"  (1.727 m)   Wt 179 lb (81.2 kg)   SpO2 95%   BMI 27.22 kg/m    Review of Systems  Constitutional:  Positive for activity change and fatigue. Negative for appetite change, chills and fever.  HENT:  Positive for congestion, postnasal drip, rhinorrhea, sinus pressure, sinus pain, sore throat and voice change. Negative for ear pain.   Respiratory:  Positive for cough, chest tightness, shortness of breath and wheezing.   Cardiovascular: Negative.  Negative for chest pain and palpitations.  Gastrointestinal:  Positive for diarrhea. Negative for nausea and vomiting.  Musculoskeletal:  Negative for myalgias.  Neurological:  Positive for headaches.    Physical Exam Vitals reviewed.  Constitutional:      Appearance: Normal appearance. He is ill-appearing.  HENT:     Head: Normocephalic and atraumatic.     Right Ear: Tympanic membrane, ear canal and external ear normal.     Left Ear: Tympanic membrane, ear canal and external ear normal.     Nose: Congestion and rhinorrhea present.     Mouth/Throat:     Mouth: Mucous membranes are moist.     Pharynx: Posterior oropharyngeal erythema present.  Eyes:     Pupils: Pupils are equal, round, and reactive to light.  Cardiovascular:     Rate and Rhythm: Normal rate and regular rhythm.  Heart sounds: Normal heart sounds. No murmur heard. Pulmonary:     Effort: Pulmonary effort is normal. No accessory muscle usage or respiratory distress.     Breath sounds: Examination of the right-upper field reveals wheezing. Examination of the left-upper field reveals wheezing. Examination of the right-middle field reveals decreased breath sounds. Examination of the left-middle field reveals decreased breath sounds. Decreased breath sounds and wheezing present.  Neurological:     Mental Status: He is alert and oriented to person, place, and time.  Psychiatric:        Mood and Affect: Mood normal.        Behavior: Behavior normal.        Assessment/Plan: 1. Acute bacterial bronchitis (Primary) Levofloxacin and prednisone taper prescribed, take until gone. Tussionex prescribed as needed for cough - chlorpheniramine-HYDROcodone (TUSSIONEX) 10-8 MG/5ML; Take 5 mLs by mouth every 12 (twelve) hours as needed.  Dispense: 140 mL; Refill: 0 - predniSONE (STERAPRED UNI-PAK 21 TAB) 10 MG (21) TBPK tablet; Use as directed for 6 days  Dispense: 21 tablet; Refill: 0 - levofloxacin (LEVAQUIN) 750 MG tablet; Take 1 tablet (750 mg total) by mouth daily. Take with food  Dispense: 7 tablet; Refill: 0  2. Acute cough Tussionex prescribed as needed for cough - chlorpheniramine-HYDROcodone (TUSSIONEX) 10-8 MG/5ML; Take 5 mLs by mouth every 12 (twelve) hours as needed.  Dispense: 140 mL; Refill: 0  3. Wheezing Prednisone taper prescribed, take until gone  - predniSONE (STERAPRED UNI-PAK 21 TAB) 10 MG (21) TBPK tablet; Use as directed for 6 days  Dispense: 21 tablet; Refill: 0   General Counseling: Vincent Morton verbalizes understanding of the findings of todays visit and agrees with plan of treatment. I have discussed any further diagnostic evaluation that may be needed or ordered today. We also reviewed his medications today. he has been encouraged to call the office with any questions or concerns that should arise related to todays visit.    Counseling:    No orders of the defined types were placed in this encounter.   Meds ordered this encounter  Medications   chlorpheniramine-HYDROcodone (TUSSIONEX) 10-8 MG/5ML    Sig: Take 5 mLs by mouth every 12 (twelve) hours as needed.    Dispense:  140 mL    Refill:  0   predniSONE (STERAPRED UNI-PAK 21 TAB) 10 MG (21) TBPK tablet    Sig: Use as directed for 6 days    Dispense:  21 tablet    Refill:  0   levofloxacin (LEVAQUIN) 750 MG tablet    Sig: Take 1 tablet (750 mg total) by mouth daily. Take with food    Dispense:  7 tablet    Refill:  0    Return if symptoms worsen or fail  to improve.  [place] Controlled Substance Database was reviewed by me for overdose risk score (ORS)  Time spent:30 Minutes Time spent with patient included reviewing progress notes, labs, imaging studies, and discussing plan for follow up.   This patient was seen by Laurence Pons, FNP-C in collaboration with Dr. Verneta Gone as a part of collaborative care agreement.  Joany Khatib R. Bobbi Burow, MSN, FNP-C Internal Medicine

## 2024-01-13 ENCOUNTER — Telehealth: Payer: Self-pay

## 2024-01-13 NOTE — Telephone Encounter (Signed)
 Pt wife called that his glucose 288 and as per alyssa advised that he can split prednisone in half and for like if its 4 day then he cut 2 days

## 2024-01-25 ENCOUNTER — Encounter: Payer: BC Managed Care – PPO | Admitting: Nurse Practitioner

## 2024-01-28 ENCOUNTER — Encounter: Payer: Self-pay | Admitting: Nurse Practitioner

## 2024-01-28 DIAGNOSIS — I152 Hypertension secondary to endocrine disorders: Secondary | ICD-10-CM | POA: Insufficient documentation

## 2024-01-28 DIAGNOSIS — E119 Type 2 diabetes mellitus without complications: Secondary | ICD-10-CM | POA: Insufficient documentation

## 2024-01-28 DIAGNOSIS — E1169 Type 2 diabetes mellitus with other specified complication: Secondary | ICD-10-CM | POA: Insufficient documentation

## 2024-01-30 ENCOUNTER — Telehealth: Payer: Self-pay

## 2024-01-31 ENCOUNTER — Other Ambulatory Visit: Payer: Self-pay

## 2024-01-31 ENCOUNTER — Ambulatory Visit: Admitting: Nurse Practitioner

## 2024-01-31 MED ORDER — VALACYCLOVIR HCL 1 G PO TABS
1000.0000 mg | ORAL_TABLET | Freq: Two times a day (BID) | ORAL | 1 refills | Status: AC
Start: 2024-01-31 — End: ?

## 2024-01-31 NOTE — Telephone Encounter (Signed)
 Lmom  to pt as per alyssa sent valtrex

## 2024-02-02 ENCOUNTER — Ambulatory Visit: Admitting: Nurse Practitioner

## 2024-02-09 ENCOUNTER — Encounter: Payer: Self-pay | Admitting: Nurse Practitioner

## 2024-02-13 ENCOUNTER — Inpatient Hospital Stay: Admission: RE | Admit: 2024-02-13 | Source: Ambulatory Visit

## 2024-02-14 ENCOUNTER — Telehealth: Payer: Self-pay | Admitting: Nurse Practitioner

## 2024-02-14 NOTE — Telephone Encounter (Signed)
 Left vm and sent mychart message to confirm 02/21/24 appointment-Toni

## 2024-02-17 ENCOUNTER — Inpatient Hospital Stay: Admission: RE | Admit: 2024-02-17 | Source: Ambulatory Visit

## 2024-02-18 ENCOUNTER — Encounter: Payer: Self-pay | Admitting: Nurse Practitioner

## 2024-02-21 ENCOUNTER — Encounter: Admitting: Nurse Practitioner

## 2024-03-02 ENCOUNTER — Ambulatory Visit
Admission: RE | Admit: 2024-03-02 | Discharge: 2024-03-02 | Disposition: A | Discharge status: Home / Self Care | Source: Ambulatory Visit | Attending: Nurse Practitioner | Admitting: Nurse Practitioner

## 2024-03-02 DIAGNOSIS — F17210 Nicotine dependence, cigarettes, uncomplicated: Secondary | ICD-10-CM

## 2024-03-02 DIAGNOSIS — Z122 Encounter for screening for malignant neoplasm of respiratory organs: Secondary | ICD-10-CM | POA: Diagnosis not present

## 2024-03-08 ENCOUNTER — Telehealth: Payer: Self-pay | Admitting: Gastroenterology

## 2024-03-08 ENCOUNTER — Ambulatory Visit: Admitting: Gastroenterology

## 2024-03-08 NOTE — Telephone Encounter (Signed)
 The patient's wife called to reschedule his appointment, as he will be unable to attend due to a work conflict. We rescheduled his appointment with Dr. Baldomero Bone for Mar 21, 2024, at 2:45 PM.

## 2024-03-14 ENCOUNTER — Encounter: Admitting: Nurse Practitioner

## 2024-03-15 ENCOUNTER — Encounter: Admitting: Nurse Practitioner

## 2024-03-18 ENCOUNTER — Other Ambulatory Visit: Payer: Self-pay | Admitting: Urology

## 2024-03-21 ENCOUNTER — Ambulatory Visit: Admitting: Gastroenterology

## 2024-04-17 ENCOUNTER — Ambulatory Visit: Payer: Self-pay | Admitting: Nurse Practitioner

## 2024-04-18 ENCOUNTER — Encounter: Payer: Self-pay | Admitting: Nurse Practitioner

## 2024-04-18 ENCOUNTER — Ambulatory Visit: Admitting: Nurse Practitioner

## 2024-04-18 VITALS — BP 130/88 | HR 75 | Temp 98.6°F | Resp 16 | Ht 68.0 in | Wt 174.4 lb

## 2024-04-18 DIAGNOSIS — J432 Centrilobular emphysema: Secondary | ICD-10-CM

## 2024-04-18 DIAGNOSIS — E785 Hyperlipidemia, unspecified: Secondary | ICD-10-CM

## 2024-04-18 DIAGNOSIS — F17210 Nicotine dependence, cigarettes, uncomplicated: Secondary | ICD-10-CM | POA: Diagnosis not present

## 2024-04-18 DIAGNOSIS — E1169 Type 2 diabetes mellitus with other specified complication: Secondary | ICD-10-CM | POA: Diagnosis not present

## 2024-04-18 DIAGNOSIS — R0602 Shortness of breath: Secondary | ICD-10-CM | POA: Diagnosis not present

## 2024-04-18 MED ORDER — ALBUTEROL SULFATE HFA 108 (90 BASE) MCG/ACT IN AERS
2.0000 | INHALATION_SPRAY | Freq: Four times a day (QID) | RESPIRATORY_TRACT | 2 refills | Status: AC | PRN
Start: 2024-04-18 — End: ?

## 2024-04-18 MED ORDER — TRELEGY ELLIPTA 100-62.5-25 MCG/ACT IN AEPB
1.0000 | INHALATION_SPRAY | Freq: Every day | RESPIRATORY_TRACT | 11 refills | Status: AC
Start: 2024-04-18 — End: 2024-04-21

## 2024-04-18 NOTE — Progress Notes (Signed)
 [facility] [address] [place], [place] [address]  Internal MEDICINE  Office Visit Note  Patient Name: Vincent Morton  [id number]  [id number]  Date of Service: 04/18/2024  Chief Complaint  Patient presents with   Acute Visit    SOB review CT results.      HPI Vincent Morton presents for an acute sick visit for acute SOB and CT chest results Increased SOB that is affecting work. He has had to leave work due to being very short of breath. He has missed enough work that his job is make him go into their smoking cessation program through their company. He was treated for bronchitis in march this year also.  CT chest lung cancer screening was done and results reviewed today. Ct chest identified centrilobular emphysema and paraseptal emphysema as well as smoking related bronchiolitis. There is 1 nodule in the right middle lobe measuring 5.1 mm but no suspicious nodules were found. This was scored Lung-RADS 2 benign appearance or behavior and continued annual screening is recommended.  He has not been able to get his labs done yet.  Diabetes -- A1c in February was 8.2 which is elevated. He was supposed to be seen sooner than today but has not been able to get off work previously.  Smoking cessation -- utilizing a program provided by his work. Smoking history is 1 ppd for at least 30 years and he is a current smoker.       Current Medication:  Outpatient Encounter Medications as of 04/18/2024  Medication Sig   albuterol  (VENTOLIN  HFA) 108 (90 Base) MCG/ACT inhaler Inhale 2 puffs into the lungs every 6 (six) hours as needed for wheezing or shortness of breath.   ASPIRIN  LOW DOSE 81 MG tablet Take 81 mg by mouth daily.   chlorpheniramine-HYDROcodone  (TUSSIONEX) 10-8 MG/5ML Take 5 mLs by mouth every 12 (twelve) hours as needed.   [EXPIRED] Fluticasone-Umeclidin-Vilant (TRELEGY ELLIPTA ) 100-62.5-25 MCG/ACT AEPB Inhale 1 puff into the lungs daily for 3 doses.   sildenafil  (REVATIO ) 20 MG  tablet TAKE 2 TO 5 TABLETS BY MOUTH 1 HOUR PRIOR TO INTERCOURSE.   valACYclovir  (VALTREX ) 1000 MG tablet Take 1 tablet (1,000 mg total) by mouth 2 (two) times daily.   [DISCONTINUED] atorvastatin  (LIPITOR) 40 MG tablet Take 1 tab po at bedtime   [DISCONTINUED] empagliflozin  (JARDIANCE ) 25 MG TABS tablet Take 1 tablet (25 mg total) by mouth daily before breakfast.   [DISCONTINUED] levofloxacin  (LEVAQUIN ) 750 MG tablet Take 1 tablet (750 mg total) by mouth daily. Take with food   [DISCONTINUED] metFORMIN  (GLUCOPHAGE -XR) 500 MG 24 hr tablet Take 2 tablets (1,000 mg total) by mouth 2 (two) times daily.   [DISCONTINUED] predniSONE  (STERAPRED UNI-PAK 21 TAB) 10 MG (21) TBPK tablet Use as directed for 6 days   [DISCONTINUED] PROAIR  HFA 108 (90 Base) MCG/ACT inhaler Inhale 2 puffs into the lungs every 4 (four) hours as needed.   [DISCONTINUED] tamsulosin  (FLOMAX ) 0.4 MG CAPS capsule Take 2 capsules (0.8 mg total) by mouth daily.   atorvastatin  (LIPITOR) 40 MG tablet Take 1 tab po at bedtime   empagliflozin  (JARDIANCE ) 25 MG TABS tablet Take 1 tablet (25 mg total) by mouth daily before breakfast.   metFORMIN  (GLUCOPHAGE -XR) 500 MG 24 hr tablet Take 2 tablets (1,000 mg total) by mouth 2 (two) times daily.   No facility-administered encounter medications on file as of 04/18/2024.      Medical History: Past Medical History:  Diagnosis Date   Diabetes mellitus without complication (HCC)  Vital Signs: BP 130/88   Pulse 75   Temp 98.6 F (37 C)   Resp 16   Ht 5' 8 (1.727 m)   Wt 174 lb 6.4 oz (79.1 kg)   SpO2 98%   BMI 26.52 kg/m    Review of Systems  Constitutional:  Positive for fatigue.  HENT:  Positive for congestion and postnasal drip.   Respiratory:  Positive for cough, chest tightness, shortness of breath and wheezing.   Cardiovascular: Negative.  Negative for chest pain and palpitations.  Gastrointestinal: Negative.   Musculoskeletal: Negative.     Physical Exam Vitals  reviewed.  Constitutional:      General: He is not in acute distress.    Appearance: Normal appearance. He is ill-appearing.  HENT:     Head: Normocephalic and atraumatic.   Eyes:     Pupils: Pupils are equal, round, and reactive to light.    Cardiovascular:     Rate and Rhythm: Normal rate and regular rhythm.     Heart sounds: Normal heart sounds. No murmur heard. Pulmonary:     Effort: Pulmonary effort is normal. No accessory muscle usage or respiratory distress.     Breath sounds: Normal air entry. Examination of the right-upper field reveals wheezing. Examination of the left-upper field reveals wheezing. Examination of the right-middle field reveals wheezing. Examination of the left-middle field reveals wheezing. Examination of the right-lower field reveals decreased breath sounds. Examination of the left-lower field reveals decreased breath sounds. Decreased breath sounds and wheezing present.   Skin:    Capillary Refill: Capillary refill takes less than 2 seconds.   Neurological:     Mental Status: He is alert and oriented to person, place, and time.   Psychiatric:        Mood and Affect: Mood normal.        Behavior: Behavior normal.       Assessment/Plan: 1. Centrilobular emphysema (HCC) (Primary) PFT ordered, start trelegy ellipta  inhaler and use albuterol  inhaler as needed. Will establish and follow up with Dr. Elfreda Bathe for pulmonology.  - Pulmonary function test; Future - Fluticasone-Umeclidin-Vilant (TRELEGY ELLIPTA ) 100-62.5-25 MCG/ACT AEPB; Inhale 1 puff into the lungs daily for 3 doses.  Dispense: 28 each; Refill: 11 - albuterol  (VENTOLIN  HFA) 108 (90 Base) MCG/ACT inhaler; Inhale 2 puffs into the lungs every 6 (six) hours as needed for wheezing or shortness of breath.  Dispense: 8 g; Refill: 2  2. SOB (shortness of breath) PFT ordered. Patient started on trelegy ellipta , samples given to patient. Albuterol  inhaler reordered. Will follow up with pulmonology  with Dr. Elfreda Bathe after having the PFT done.  - Pulmonary function test; Future - Fluticasone-Umeclidin-Vilant (TRELEGY ELLIPTA ) 100-62.5-25 MCG/ACT AEPB; Inhale 1 puff into the lungs daily for 3 doses.  Dispense: 28 each; Refill: 11 - albuterol  (VENTOLIN  HFA) 108 (90 Base) MCG/ACT inhaler; Inhale 2 puffs into the lungs every 6 (six) hours as needed for wheezing or shortness of breath.  Dispense: 8 g; Refill: 2  3. Type 2 diabetes mellitus with other specified complication, without long-term current use of insulin (HCC) Continue jardiance  and metformin  as prescribed.  - empagliflozin  (JARDIANCE ) 25 MG TABS tablet; Take 1 tablet (25 mg total) by mouth daily before breakfast.  Dispense: 30 tablet; Refill: 5 - metFORMIN  (GLUCOPHAGE -XR) 500 MG 24 hr tablet; Take 2 tablets (1,000 mg total) by mouth 2 (two) times daily.  Dispense: 360 tablet; Refill: 1  4. Hyperlipidemia associated with type 2 diabetes mellitus (HCC) Continue atorvastatin   as prescribed. Patient reminded to get his labs drawn asap.  - atorvastatin  (LIPITOR) 40 MG tablet; Take 1 tab po at bedtime  Dispense: 90 tablet; Refill: 3  5. Smokes 1 pack of cigarettes per day Working on smoking cessation with a program provided by his employer.    General Counseling: [place] understanding of the findings of todays visit and agrees with plan of treatment. I have discussed any further diagnostic evaluation that may be needed or ordered today. We also reviewed his medications today. he has been encouraged to call the office with any questions or concerns that should arise related to todays visit.    Counseling:    Orders Placed This Encounter  Procedures   Pulmonary function test    Meds ordered this encounter  Medications   Fluticasone-Umeclidin-Vilant (TRELEGY ELLIPTA ) 100-62.5-25 MCG/ACT AEPB    Sig: Inhale 1 puff into the lungs daily for 3 doses.    Dispense:  28 each    Refill:  11    Fill new script today    albuterol  (VENTOLIN  HFA) 108 (90 Base) MCG/ACT inhaler    Sig: Inhale 2 puffs into the lungs every 6 (six) hours as needed for wheezing or shortness of breath.    Dispense:  8 g    Refill:  2   atorvastatin  (LIPITOR) 40 MG tablet    Sig: Take 1 tab po at bedtime    Dispense:  90 tablet    Refill:  3   empagliflozin  (JARDIANCE ) 25 MG TABS tablet    Sig: Take 1 tablet (25 mg total) by mouth daily before breakfast.    Dispense:  30 tablet    Refill:  5    Fill new script today, discontinue 10 mg tablet.   metFORMIN  (GLUCOPHAGE -XR) 500 MG 24 hr tablet    Sig: Take 2 tablets (1,000 mg total) by mouth 2 (two) times daily.    Dispense:  360 tablet    Refill:  1    Return in about 4 weeks (around 05/16/2024) for F/U, Larisa Lanius PCP new med also need new patient visit with DSK for COPD.  [place] Controlled Substance Database was reviewed by me for overdose risk score (ORS)  Time spent:30 Minutes Time spent with patient included reviewing progress notes, labs, imaging studies, and discussing plan for follow up.   This patient was seen by Mardy Maxin, FNP-C in collaboration with Dr. Sigrid Bathe as a part of collaborative care agreement.  Rosabel Sermeno R. Maxin, MSN, FNP-C Internal Medicine

## 2024-04-19 ENCOUNTER — Ambulatory Visit: Admitting: Nurse Practitioner

## 2024-04-23 ENCOUNTER — Telehealth: Payer: Self-pay | Admitting: Nurse Practitioner

## 2024-04-23 ENCOUNTER — Telehealth: Payer: Self-pay

## 2024-04-23 NOTE — Telephone Encounter (Signed)
 Received FMLA paperwork from [employer]. Gave to Alyssa to complete-Toni

## 2024-04-30 ENCOUNTER — Telehealth: Payer: Self-pay

## 2024-04-30 ENCOUNTER — Other Ambulatory Visit: Payer: Self-pay | Admitting: Nurse Practitioner

## 2024-04-30 NOTE — Telephone Encounter (Signed)
 Spoke with Alyssa for FMLA paperwork she will be finish soon

## 2024-04-30 NOTE — Telephone Encounter (Signed)
 Please review

## 2024-05-01 ENCOUNTER — Telehealth: Payer: Self-pay | Admitting: Nurse Practitioner

## 2024-05-01 ENCOUNTER — Encounter: Payer: Self-pay | Admitting: Nurse Practitioner

## 2024-05-01 DIAGNOSIS — J432 Centrilobular emphysema: Secondary | ICD-10-CM | POA: Insufficient documentation

## 2024-05-01 MED ORDER — ATORVASTATIN CALCIUM 40 MG PO TABS
ORAL_TABLET | ORAL | 3 refills | Status: AC
Start: 2024-05-01 — End: ?

## 2024-05-01 MED ORDER — METFORMIN HCL ER 500 MG PO TB24: 1000.0000 mg | ORAL_TABLET | Freq: Two times a day (BID) | ORAL | 1 refills | Status: AC

## 2024-05-01 MED ORDER — EMPAGLIFLOZIN 25 MG PO TABS
25.0000 mg | ORAL_TABLET | Freq: Every day | ORAL | 5 refills | Status: AC
Start: 2024-05-01 — End: ?

## 2024-05-01 NOTE — Telephone Encounter (Signed)
 FMLA completed. Faxed back to [employer]; [phone]. Gave copy and letter to patient. Scanned-Toni

## 2024-05-01 NOTE — Telephone Encounter (Signed)
 Done and toni already called pt

## 2024-05-03 ENCOUNTER — Telehealth: Payer: Self-pay | Admitting: Nurse Practitioner

## 2024-05-03 NOTE — Telephone Encounter (Signed)
 Patient's wife called. [employer] requesting FMLA be faxed again. 2nd page distorted. Faxed again-Toni

## 2024-05-10 ENCOUNTER — Ambulatory Visit: Admitting: Nurse Practitioner

## 2024-05-16 ENCOUNTER — Ambulatory Visit: Admitting: Nurse Practitioner

## 2024-05-16 ENCOUNTER — Encounter: Admitting: Internal Medicine

## 2024-05-22 ENCOUNTER — Telehealth: Payer: Self-pay | Admitting: Nurse Practitioner

## 2024-05-22 NOTE — Telephone Encounter (Addendum)
 Wife called about [employer] FMLA for diabetes. She stated they received the one for copd, but needs this one completed as well. I gave her fax # at my desk [phone]. She will have them fax to this #.  Received form from [employer]. Gave to Alyssa to complete regarding patient's diabetes-Toni

## 2024-05-29 ENCOUNTER — Ambulatory Visit: Admitting: Internal Medicine

## 2024-05-30 ENCOUNTER — Encounter: Admitting: Internal Medicine

## 2024-06-05 ENCOUNTER — Telehealth: Payer: Self-pay | Admitting: Nurse Practitioner

## 2024-06-05 NOTE — Telephone Encounter (Signed)
 FMLA regarding diabetes completed. Patient to come in office this afternoon to sign before I fax back to [employer]-Toni

## 2024-06-05 NOTE — Telephone Encounter (Signed)
 FMLA pertaining to diabetes completed. Faxed to [employer]; [phone]. Scanned-Toni

## 2024-06-11 ENCOUNTER — Ambulatory Visit: Admitting: Nurse Practitioner

## 2024-06-12 ENCOUNTER — Ambulatory Visit: Admitting: Internal Medicine

## 2024-06-12 ENCOUNTER — Telehealth: Payer: Self-pay | Admitting: Internal Medicine

## 2024-06-12 NOTE — Telephone Encounter (Signed)
 Lvm to r/s missed pft before patient sees dsk-Vincent Morton

## 2024-06-26 ENCOUNTER — Ambulatory Visit: Admitting: Internal Medicine

## 2024-06-27 ENCOUNTER — Ambulatory Visit (INDEPENDENT_AMBULATORY_CARE_PROVIDER_SITE_OTHER): Admitting: Internal Medicine

## 2024-06-27 DIAGNOSIS — R0602 Shortness of breath: Secondary | ICD-10-CM

## 2024-06-27 DIAGNOSIS — J432 Centrilobular emphysema: Secondary | ICD-10-CM | POA: Diagnosis not present

## 2024-06-28 ENCOUNTER — Ambulatory Visit: Admitting: Nurse Practitioner

## 2024-07-02 ENCOUNTER — Ambulatory Visit: Admitting: Nurse Practitioner

## 2024-07-03 ENCOUNTER — Telehealth: Payer: Self-pay | Admitting: Nurse Practitioner

## 2024-07-03 NOTE — Telephone Encounter (Signed)
 Received FMLA paperwork from [employer]. Gave to Alyssa to complete-Toni

## 2024-07-04 ENCOUNTER — Ambulatory Visit: Admitting: Nurse Practitioner

## 2024-07-10 ENCOUNTER — Ambulatory Visit: Admitting: Internal Medicine

## 2024-07-11 ENCOUNTER — Encounter: Payer: Self-pay | Admitting: Intensive Care

## 2024-07-11 ENCOUNTER — Emergency Department

## 2024-07-11 ENCOUNTER — Emergency Department
Admission: EM | Admit: 2024-07-11 | Discharge: 2024-07-11 | Disposition: A | Discharge status: Home / Self Care | Attending: Emergency Medicine | Admitting: Emergency Medicine

## 2024-07-11 ENCOUNTER — Other Ambulatory Visit: Payer: Self-pay

## 2024-07-11 DIAGNOSIS — N281 Cyst of kidney, acquired: Secondary | ICD-10-CM | POA: Diagnosis not present

## 2024-07-11 DIAGNOSIS — I10 Essential (primary) hypertension: Secondary | ICD-10-CM | POA: Insufficient documentation

## 2024-07-11 DIAGNOSIS — R1084 Generalized abdominal pain: Secondary | ICD-10-CM | POA: Insufficient documentation

## 2024-07-11 DIAGNOSIS — R1032 Left lower quadrant pain: Secondary | ICD-10-CM | POA: Diagnosis not present

## 2024-07-11 DIAGNOSIS — E119 Type 2 diabetes mellitus without complications: Secondary | ICD-10-CM | POA: Diagnosis not present

## 2024-07-11 DIAGNOSIS — R109 Unspecified abdominal pain: Secondary | ICD-10-CM | POA: Diagnosis not present

## 2024-07-11 DIAGNOSIS — N400 Enlarged prostate without lower urinary tract symptoms: Secondary | ICD-10-CM | POA: Diagnosis not present

## 2024-07-11 HISTORY — DX: Chronic obstructive pulmonary disease, unspecified: J44.9

## 2024-07-11 LAB — URINALYSIS, ROUTINE W REFLEX MICROSCOPIC
Bacteria, UA: NONE SEEN
Bilirubin Urine: NEGATIVE
Glucose, UA: >=500 mg/dL — AB
Hgb urine dipstick: NEGATIVE
Ketones, ur: NEGATIVE mg/dL
Leukocytes,Ua: NEGATIVE
Nitrite: NEGATIVE
Protein, ur: 30 mg/dL — AB
Specific Gravity, Urine: 1.026 (ref 1.005–1.030)
Squamous Epithelial / HPF: 0 /HPF (ref 0–5)
pH: 5 (ref 5.0–8.0)

## 2024-07-11 LAB — COMPREHENSIVE METABOLIC PANEL WITH GFR
ALT: 19 U/L (ref 0–44)
AST: 19 U/L (ref 15–41)
Albumin: 4.2 g/dL (ref 3.5–5.0)
Alkaline Phosphatase: 95 U/L (ref 38–126)
Anion gap: 12 (ref 5–15)
BUN: 19 mg/dL (ref 6–20)
CO2: 21 mmol/L — ABNORMAL LOW (ref 22–32)
Calcium: 9.6 mg/dL (ref 8.9–10.3)
Chloride: 103 mmol/L (ref 98–111)
Creatinine, Ser: 1.16 mg/dL (ref 0.61–1.24)
GFR, Estimated: >60 mL/min (ref >60)
Glucose, Bld: 383 mg/dL — ABNORMAL HIGH (ref 70–99)
Potassium: 4.2 mmol/L (ref 3.5–5.1)
Sodium: 136 mmol/L (ref 135–145)
Total Bilirubin: 0.5 mg/dL (ref 0.0–1.2)
Total Protein: 7.6 g/dL (ref 6.5–8.1)

## 2024-07-11 LAB — LIPASE, BLOOD: Lipase: 98 U/L — ABNORMAL HIGH (ref 11–51)

## 2024-07-11 LAB — CBC
HCT: 50.4 % (ref 39.0–52.0)
Hemoglobin: 16.4 g/dL (ref 13.0–17.0)
MCH: 28 pg (ref 26.0–34.0)
MCHC: 32.5 g/dL (ref 30.0–36.0)
MCV: 86 fL (ref 80.0–100.0)
Platelets: 192 K/uL (ref 150–400)
RBC: 5.86 MIL/uL — ABNORMAL HIGH (ref 4.22–5.81)
RDW: 12.9 % (ref 11.5–15.5)
WBC: 7.6 K/uL (ref 4.0–10.5)
nRBC: 0 % (ref 0.0–0.2)

## 2024-07-11 MED ORDER — IOHEXOL 300 MG/ML  SOLN
100.0000 mL | Freq: Once | INTRAMUSCULAR | Status: AC | PRN
  Administered 2024-07-11: 100 mL via INTRAVENOUS

## 2024-07-11 NOTE — ED Triage Notes (Signed)
 Patient c/o abdominal pain that started last week and has progressively gotten worse.   Takes flomax  daily for prostate issues. Reports hard to urinate this AM with sharp pains in abdomen

## 2024-07-11 NOTE — Discharge Instructions (Signed)
 Please follow-up with your outpatient provider as well as the general surgeon if you continue to notice a bulge in your groin area.  Please return for any new, worsening, or changing symptoms or other concerns.  It was a pleasure caring for you

## 2024-07-11 NOTE — ED Provider Notes (Signed)
 [facility] Provider Note    Event Date/Time   First MD Initiated Contact with Patient 07/11/24 0845     (approximate)   History   Abdominal Pain   HPI  Vincent Morton is a 58 y.o. male with a past medical history of hypertension, type 2 diabetes, hyperlipidemia, emphysema who presents today for evaluation of abdominal pain.  Patient reports that his symptoms have been ongoing for the last couple of days ever since he started lifting heavy things at work.  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  He noticed a bulge in his left groin after lifting these heavy things.  He reports that the bulge goes away when he sits down.  He has not had any difficulty urinating or moving his bowels.  No nausea or vomiting.  He reports that he also has intermittent epigastric abdominal pain but he reports that he drinks alcohol daily though he reports that his wife is having him cut down.  He has no epigastric abdominal pain currently.  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   Erectile dysfunction due to arterial insufficiency 09/18/2020   Benign prostatic hyperplasia with lower urinary tract symptoms 09/18/2020          Physical Exam   Triage Vital Signs: ED Triage Vitals  Encounter Vitals Group     BP 07/11/24 0828 133/76     Girls Systolic BP Percentile --      Girls  Diastolic BP Percentile --      Boys Systolic BP Percentile --      Boys Diastolic BP Percentile --      Pulse Rate 07/11/24 0828 84     Resp 07/11/24 0828 18     Temp 07/11/24 0828 97.7 F (36.5 C)     Temp src --      SpO2 07/11/24 0828 96 %     Weight 07/11/24 0821 175 lb (79.4 kg)     Height 07/11/24 0821 5' 8 (1.727 m)     Head Circumference --      Peak Flow --      Pain Score 07/11/24 0821 5     Pain Loc --      Pain Education --      Exclude from Growth Chart --     Most recent vital signs: Vitals:   07/11/24 0828 07/11/24 1125  BP: 133/76 131/85  Pulse: 84 73  Resp: 18 18  Temp: 97.7 F (36.5 C)   SpO2: 96% 97%    Physical Exam Vitals and nursing note reviewed.  Constitutional:      General: Awake and alert. No acute distress.    Appearance: Normal appearance. The patient is normal weight.  HENT:     Head: Normocephalic and atraumatic.     Mouth: Mucous membranes are moist.  Eyes:     General: PERRL. Normal EOMs  Right eye: No discharge.        Left eye: No discharge.     Conjunctiva/sclera: Conjunctivae normal.  Cardiovascular:     Rate and Rhythm: Normal rate and regular rhythm.     Pulses: Normal pulses.  Pulmonary:     Effort: Pulmonary effort is normal. No respiratory distress.     Breath sounds: Normal breath sounds.  Abdominal:     Abdomen is soft. There is no abdominal tenderness. No rebound or guarding. No distention.  No bulges noted.  Negative Murphy sign. Musculoskeletal:        General: No swelling. Normal range of motion.     Cervical back: Normal range of motion and neck supple.  Skin:    General: Skin is warm and dry.     Capillary Refill: Capillary refill takes less than 2 seconds.     Findings: No rash.  Neurological:     Mental Status: The patient is awake and alert.      ED Results / Procedures / Treatments   Labs (all labs ordered are listed, but only abnormal results are displayed) Labs Reviewed  LIPASE, BLOOD -  Abnormal; Notable for the following components:      Result Value   Lipase 98 (*)    All other components within normal limits  COMPREHENSIVE METABOLIC PANEL WITH GFR - Abnormal; Notable for the following components:   CO2 21 (*)    Glucose, Bld 383 (*)    All other components within normal limits  CBC - Abnormal; Notable for the following components:   RBC 5.86 (*)    All other components within normal limits  URINALYSIS, ROUTINE W REFLEX MICROSCOPIC - Abnormal; Notable for the following components:   Color, Urine YELLOW (*)    APPearance CLEAR (*)    Glucose, UA >=500 (*)    Protein, ur 30 (*)    All other components within normal limits     EKG     RADIOLOGY I independently reviewed and interpreted imaging and agree with radiologists findings.     PROCEDURES:  Critical Care performed:   Procedures   MEDICATIONS ORDERED IN ED: Medications  iohexol  (OMNIPAQUE ) 300 MG/ML solution 100 mL (100 mLs Intravenous Contrast Given 07/11/24 1000)     IMPRESSION / MDM / ASSESSMENT AND PLAN / ED COURSE  I reviewed the triage vital signs and the nursing notes.   Differential diagnosis includes, but is not limited to, hernia, diverticulitis, appendicitis, cystitis, pancreatitis, biliary pathology.  Patient is awake and alert, hemodynamically stable and afebrile.  He is nontoxic in appearance.  He has no obvious abnormalities on exam, I had him stand up and cough and there was no bulges noted.  Labs obtained are revealing for a mild lipase elevation to 98, which is higher than previous for him.  He does endorse drinking alcohol daily and I advised that he cut down on this.  He has no tenderness in his right upper quadrant.  CT scan obtained for further evaluation of hernia, pancreatitis, or other intra-abdominal pathology, and is overall reassuring.  There is no peripancreatic fluid, pancreatic ductal dilatation, or surrounding inflammatory changes.  He has no gallstones,  gallbladder wall thickening, or biliary dilatation.  Therefore I do not feel that he requires admission at this time for treatment of possible early pancreatitis, and he is also able to eat and drink without difficulty.  We did discuss the possibility of pancreatic related injury from alcohol use and he agrees to  cut down.  I also advised outpatient follow-up with general surgery if he continues to notice bulging in his groin when lifting heavy objects and we discussed the signs and symptoms of incarcerated or strangulated hernia that would warrant strict return to the emergency department.  We also discussed return precautions for pancreatitis, and other new, worsening, or change in symptoms or other concerns.  Patient understands and agrees with plan.  He was discharged in stable condition.  He has no pain at the time of discharge.   Patient's presentation is most consistent with acute complicated illness / injury requiring diagnostic workup.     FINAL CLINICAL IMPRESSION(S) / ED DIAGNOSES   Final diagnoses:  Generalized abdominal pain     Rx / DC Orders   ED Discharge Orders     None        Note:  This document was prepared using Dragon voice recognition software and may include unintentional dictation errors.   Markis Langland E, PA-C 07/11/24 1438    Jacolyn Pae, MD 07/11/24 1440

## 2024-07-11 NOTE — ED Notes (Signed)
 Pt returned from CT

## 2024-07-15 NOTE — Procedures (Signed)
 [facility] [address] [place], [address]    Complete Pulmonary Function Testing Interpretation:  FINDINGS:  The forced vital capacity is normal.  FEV1 is normal.  F1 FVC ratio is mildly decreased.  Postbronchodilator there is no significant change noted in the FEV1.  Total lung capacity is mildly decreased.  Residual volume is decreased FRC is decreased.  The DLCO was mildly decreased.  IMPRESSION:  This pulmonary function study is suggestive of a mild restrictive lung disease clinical correlation is recommended  Elfreda DELENA Bathe, MD [facility] Pulmonary Critical Care Medicine Sleep Medicine

## 2024-07-17 LAB — PULMONARY FUNCTION TEST

## 2024-07-30 DIAGNOSIS — E1169 Type 2 diabetes mellitus with other specified complication: Secondary | ICD-10-CM | POA: Diagnosis not present

## 2024-07-30 DIAGNOSIS — E119 Type 2 diabetes mellitus without complications: Secondary | ICD-10-CM | POA: Diagnosis not present

## 2024-07-30 DIAGNOSIS — Z1331 Encounter for screening for depression: Secondary | ICD-10-CM | POA: Diagnosis not present

## 2024-07-30 DIAGNOSIS — Z720 Tobacco use: Secondary | ICD-10-CM | POA: Diagnosis not present

## 2024-07-30 DIAGNOSIS — J432 Centrilobular emphysema: Secondary | ICD-10-CM | POA: Diagnosis not present

## 2024-07-30 DIAGNOSIS — F17210 Nicotine dependence, cigarettes, uncomplicated: Secondary | ICD-10-CM | POA: Diagnosis not present

## 2024-08-29 DIAGNOSIS — Z0000 Encounter for general adult medical examination without abnormal findings: Secondary | ICD-10-CM | POA: Diagnosis not present

## 2024-08-29 DIAGNOSIS — Z125 Encounter for screening for malignant neoplasm of prostate: Secondary | ICD-10-CM | POA: Diagnosis not present

## 2024-08-29 DIAGNOSIS — E785 Hyperlipidemia, unspecified: Secondary | ICD-10-CM | POA: Diagnosis not present

## 2024-08-29 DIAGNOSIS — E119 Type 2 diabetes mellitus without complications: Secondary | ICD-10-CM | POA: Diagnosis not present

## 2024-08-29 DIAGNOSIS — Z114 Encounter for screening for human immunodeficiency virus [HIV]: Secondary | ICD-10-CM | POA: Diagnosis not present

## 2024-09-07 DIAGNOSIS — Z0000 Encounter for general adult medical examination without abnormal findings: Secondary | ICD-10-CM | POA: Diagnosis not present

## 2024-09-07 DIAGNOSIS — E1169 Type 2 diabetes mellitus with other specified complication: Secondary | ICD-10-CM | POA: Diagnosis not present

## 2024-09-07 DIAGNOSIS — J432 Centrilobular emphysema: Secondary | ICD-10-CM | POA: Diagnosis not present

## 2024-09-07 DIAGNOSIS — E119 Type 2 diabetes mellitus without complications: Secondary | ICD-10-CM | POA: Diagnosis not present

## 2024-09-07 DIAGNOSIS — R809 Proteinuria, unspecified: Secondary | ICD-10-CM | POA: Diagnosis not present

## 2024-09-09 ENCOUNTER — Other Ambulatory Visit: Payer: Self-pay | Admitting: Nurse Practitioner

## 2024-11-12 ENCOUNTER — Emergency Department
Admission: EM | Admit: 2024-11-12 | Discharge: 2024-11-12 | Disposition: A | Discharge status: Home / Self Care | Attending: Emergency Medicine | Admitting: Emergency Medicine

## 2024-11-12 ENCOUNTER — Other Ambulatory Visit: Payer: Self-pay

## 2024-11-12 DIAGNOSIS — J101 Influenza due to other identified influenza virus with other respiratory manifestations: Secondary | ICD-10-CM | POA: Insufficient documentation

## 2024-11-12 DIAGNOSIS — R509 Fever, unspecified: Secondary | ICD-10-CM | POA: Diagnosis present

## 2024-11-12 NOTE — ED Triage Notes (Signed)
 Pt to ED for bodyaches, cough, h/a since friday. Reports home test +flu. Wife here for same

## 2024-11-12 NOTE — ED Provider Notes (Signed)
" ° °  [facility] Provider Note    Event Date/Time   First MD Initiated Contact with Patient 11/12/24 [phone]     (approximate)   History   flu symptoms   HPI  Vincent Morton is a 59 y.o. male who presents with complaints of fatigue, cough, body aches, fever which started 3 to 4 days ago.  He took a home test which was positive for influenza A.  He reports he is starting to feel better but does not think he can go to work yet.  No shortness of breath.  Does have a history of COPD and diabetes.     Physical Exam   Triage Vital Signs: ED Triage Vitals  Encounter Vitals Group     BP 11/12/24 0909 131/82     Girls Systolic BP Percentile --      Girls Diastolic BP Percentile --      Boys Systolic BP Percentile --      Boys Diastolic BP Percentile --      Pulse Rate 11/12/24 0907 97     Resp 11/12/24 0907 18     Temp 11/12/24 0907 98 F (36.7 C)     Temp src --      SpO2 11/12/24 0907 97 %     Weight 11/12/24 0908 79.4 kg (175 lb)     Height 11/12/24 0908 1.727 m (5' 8)     Head Circumference --      Peak Flow --      Pain Score 11/12/24 0908 7     Pain Loc --      Pain Education --      Exclude from Growth Chart --     Most recent vital signs: Vitals:   11/12/24 0907 11/12/24 0909  BP:  131/82  Pulse: 97   Resp: 18   Temp: 98 F (36.7 C)   SpO2: 97%      General: Awake, no distress.  Well-appearing CV:  Good peripheral perfusion.  Resp:  Normal effort.  Clear to auscultation, no rales, no wheezing Abd:  No distention.  Other:     ED Results / Procedures / Treatments   Labs (all labs ordered are listed, but only abnormal results are displayed) Labs Reviewed - No data to display   EKG     RADIOLOGY     PROCEDURES:  Critical Care performed:   Procedures   MEDICATIONS ORDERED IN ED: Medications - No data to display   IMPRESSION / MDM / ASSESSMENT AND PLAN / ED COURSE  I reviewed the triage vital signs and the nursing  notes. Patient's presentation is most consistent with acute illness / injury with system symptoms.  Patient with influenza type A, vital signs are reassuring, exam is unremarkable.  Recommend continued supportive care outside of the window for Tamiflu.  Work note provided        FINAL CLINICAL IMPRESSION(S) / ED DIAGNOSES   Final diagnoses:  Influenza A     Rx / DC Orders   ED Discharge Orders     None        Note:  This document was prepared using Dragon voice recognition software and may include unintentional dictation errors.   Arlander Charleston, MD 11/12/24 [phone]  "

## 2024-11-12 NOTE — ED Notes (Signed)
 See triage note  States he was seen and dx'd with the flu on Friday Cont's to have sxs'   Afebrile on arrival

## 2025-07-10 ENCOUNTER — Encounter: Admitting: Internal Medicine
